# Patient Record
Sex: Male | Born: 1965
Health system: Southern US, Community
[De-identification: ages and names within clinical notes are randomized; demographics above are authoritative.]

## PROBLEM LIST (undated history)

## (undated) DIAGNOSIS — F419 Anxiety disorder, unspecified: Secondary | ICD-10-CM

## (undated) DIAGNOSIS — F329 Major depressive disorder, single episode, unspecified: Secondary | ICD-10-CM

## (undated) DIAGNOSIS — J45909 Unspecified asthma, uncomplicated: Secondary | ICD-10-CM

## (undated) DIAGNOSIS — R454 Irritability and anger: Secondary | ICD-10-CM

## (undated) DIAGNOSIS — I8390 Asymptomatic varicose veins of unspecified lower extremity: Secondary | ICD-10-CM

## (undated) DIAGNOSIS — F319 Bipolar disorder, unspecified: Secondary | ICD-10-CM

## (undated) DIAGNOSIS — F32A Depression, unspecified: Secondary | ICD-10-CM

## (undated) DIAGNOSIS — M1990 Unspecified osteoarthritis, unspecified site: Secondary | ICD-10-CM

## (undated) HISTORY — PX: VASECTOMY: SHX75

## (undated) HISTORY — PX: VARICOSE VEIN SURGERY: SHX832

## (undated) HISTORY — PX: TONSILLECTOMY: SUR1361

## (undated) HISTORY — PX: APPENDECTOMY: SHX54

---

## 2008-03-07 ENCOUNTER — Encounter: Admission: RE | Admit: 2008-03-07 | Discharge: 2008-03-07 | Payer: Self-pay | Admitting: Family Medicine

## 2008-04-03 ENCOUNTER — Encounter: Admission: RE | Admit: 2008-04-03 | Discharge: 2008-04-03 | Payer: Self-pay | Admitting: Family Medicine

## 2009-07-26 ENCOUNTER — Encounter: Admission: RE | Admit: 2009-07-26 | Discharge: 2009-08-29 | Payer: Self-pay | Admitting: Family Medicine

## 2012-02-16 ENCOUNTER — Emergency Department (HOSPITAL_COMMUNITY)
Admission: EM | Admit: 2012-02-16 | Discharge: 2012-02-16 | Disposition: A | Discharge status: Home / Self Care | Attending: Emergency Medicine | Admitting: Emergency Medicine

## 2012-02-16 ENCOUNTER — Encounter (HOSPITAL_COMMUNITY): Payer: Self-pay | Admitting: Emergency Medicine

## 2012-02-16 ENCOUNTER — Emergency Department (HOSPITAL_COMMUNITY)

## 2012-02-16 DIAGNOSIS — S2249XA Multiple fractures of ribs, unspecified side, initial encounter for closed fracture: Secondary | ICD-10-CM | POA: Insufficient documentation

## 2012-02-16 DIAGNOSIS — S2239XA Fracture of one rib, unspecified side, initial encounter for closed fracture: Secondary | ICD-10-CM

## 2012-02-16 DIAGNOSIS — W19XXXA Unspecified fall, initial encounter: Secondary | ICD-10-CM

## 2012-02-16 DIAGNOSIS — Z7982 Long term (current) use of aspirin: Secondary | ICD-10-CM | POA: Insufficient documentation

## 2012-02-16 DIAGNOSIS — R079 Chest pain, unspecified: Secondary | ICD-10-CM | POA: Insufficient documentation

## 2012-02-16 DIAGNOSIS — R1011 Right upper quadrant pain: Secondary | ICD-10-CM | POA: Insufficient documentation

## 2012-02-16 DIAGNOSIS — W08XXXA Fall from other furniture, initial encounter: Secondary | ICD-10-CM | POA: Insufficient documentation

## 2012-02-16 HISTORY — DX: Irritability and anger: R45.4

## 2012-02-16 LAB — COMPREHENSIVE METABOLIC PANEL
ALT: 31 U/L (ref 0–53)
AST: 28 U/L (ref 0–37)
Alkaline Phosphatase: 50 U/L (ref 39–117)
CO2: 27 mEq/L (ref 19–32)
Chloride: 102 mEq/L (ref 96–112)
GFR calc Af Amer: >90 mL/min (ref >90)
GFR calc non Af Amer: >90 mL/min (ref >90)
Glucose, Bld: 117 mg/dL — ABNORMAL HIGH (ref 70–99)
Potassium: 4 mEq/L (ref 3.5–5.1)
Sodium: 137 mEq/L (ref 135–145)

## 2012-02-16 LAB — CBC
Hemoglobin: 15.4 g/dL (ref 13.0–17.0)
Platelets: 147 10*3/uL — ABNORMAL LOW (ref 150–400)
RBC: 5.07 MIL/uL (ref 4.22–5.81)
WBC: 5.1 10*3/uL (ref 4.0–10.5)

## 2012-02-16 MED ORDER — OXYCODONE-ACETAMINOPHEN 5-325 MG PO TABS: 1.0000 | ORAL_TABLET | ORAL | Status: AC | PRN

## 2012-02-16 MED ORDER — MORPHINE SULFATE 4 MG/ML IJ SOLN
4.0000 mg | Freq: Once | INTRAMUSCULAR | Status: AC
  Administered 2012-02-16: 4 mg via INTRAVENOUS
  Filled 2012-02-16: qty 1

## 2012-02-16 MED ORDER — CYCLOBENZAPRINE HCL 10 MG PO TABS: 10.0000 mg | ORAL_TABLET | Freq: Every evening | ORAL | Status: AC | PRN

## 2012-02-16 MED ORDER — IBUPROFEN 600 MG PO TABS: 600.0000 mg | ORAL_TABLET | Freq: Four times a day (QID) | ORAL | Status: AC | PRN

## 2012-02-16 NOTE — ED Notes (Signed)
Pt brought in by ems s/p fall while standing on stool that gave way.Pt stated that he fell on his rt side,he now c/o of pain on rt side denies loc

## 2012-02-16 NOTE — Discharge Instructions (Signed)
Rib Fracture Your caregiver has diagnosed you as having a rib fracture (a break). This can occur by a blow to the chest, by a fall against a hard object, or by violent coughing or sneezing. There may be one or many breaks. Rib fractures may heal on their own within 3 to 8 weeks. The longer healing period is usually associated with a continued cough or other aggravating activities. HOME CARE INSTRUCTIONS   Avoid strenuous activity. Be careful during activities and avoid bumping the injured rib. Activities that cause pain pull on the fracture site(s) and are best avoided if possible.   Eat a normal, well-balanced diet. Drink plenty of fluids to avoid constipation.   Take deep breaths several times a day to keep lungs free of infection. Try to cough several times a day, splinting the injured area with a pillow. This will help prevent pneumonia.   Do not wear a rib belt or binder. These restrict breathing which can lead to pneumonia.   Only take over-the-counter or prescription medicines for pain, discomfort, or fever as directed by your caregiver.  SEEK MEDICAL CARE IF:  You develop a continual cough, associated with thick or bloody sputum. SEEK IMMEDIATE MEDICAL CARE IF:   You have a fever.   You have difficulty breathing.   You have nausea (feeling sick to your stomach), vomiting, or abdominal (belly) pain.   You have worsening pain, not controlled with medications.  Document Released: 09/23/2005 Document Revised: 09/12/2011 Document Reviewed: 02/25/2007 [facility] Patient Information 2012 [place], [place].  RESOURCE GUIDE  Dental Problems  Patients with Medicaid: [facility] [phone] [address]                                           [phone] [address] Phone:  [phone]                                                   Phone:  [phone]  If unable to pay or uninsured, contact:  [facility] or [facility]. to  become qualified for the adult dental clinic.  Chronic Pain Problems Contact [facility]  [phone] Patients need to be referred by their primary care doctor.  Insufficient Money for Medicine Contact [employer] Way:  call "211" or [facility] [phone] [phone].  No Primary Care Doctor Call [facility]  [phone] Other agencies that provide inexpensive medical care    [facility]  [phone]    [facility]  [phone]    [facility]  [phone]    [facility]  [phone]    [facility]  7693161212    [facility] Child Clinic  [phone]  Psychological Services [facility] Behavioral Health  [phone] [facility]  [phone] [facility] Mental Health   [phone] (emergency services [phone])  Abuse/Neglect [facility] Child Abuse Hotline [phone] [facility] Child Abuse Hotline (980)538-2807 (After Hours)  Emergency Shelter [facility] 4453426779  Maternity Homes [facility] [place] [facility] [phone] [facility] Services [phone]  MRSA Hotline #:  161-0960    [facility] Resources  [facility]  [employer]                           [facility] Dept. [address] [place]                     [address]         [address] [place] [address]  Blondell Reveal Phone:  [phone]                                  Phone:  [phone]                   Phone:  [phone]  [facility] Mental Health Phone:  [phone]  [facility] Child Abuse Hotline 671 740 5073 [phone] (After Hours)

## 2012-02-16 NOTE — ED Provider Notes (Signed)
History     CSN: [id number]  Arrival date & time 02/16/12  1523   First MD Initiated Contact with Patient 02/16/12 1528      Chief Complaint  Patient presents with  . Fall    (Consider location/radiation/quality/duration/timing/severity/associated sxs/prior treatment) HPI  45yoM present after fall. The patient states that just prior to arrival he was standing on 1 foot stool in the bathroom. He states that the stool slipped and he fell with his right side. He states that he struck the rim of the toilet. He complains of right upper quadrant pain, right lower rib pain. His pain is 9/10 at this time. He did not take anything for pain prior to arrival. It is worse with coughing, laughing, deep breathing, movement. He denies blunt head trauma. Denies neck pain, back pain. He denies numbness, tingling, weakness of his extremities. He does state that his shoulder is "a little sore".  Takes 1 baby aspirin per day   Past Medical History  Diagnosis Date  . Anger     History reviewed. No pertinent past surgical history.  History reviewed. No pertinent family history.  History  Substance Use Topics  . 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 Smokeless tobacco: Not on file  . Alcohol Use: 0.0 oz/week    4-5 Glasses of wine per week    Review of Systems  All other systems reviewed and are negative.  except as noted HPI   Allergies  Review of patient's allergies indicates no known allergies.  Home Medications   Current Outpatient Rx  Name Route Sig Dispense Refill  . ASPIRIN 81 MG PO CHEW Oral Chew 81 mg by mouth daily.    Marland Kitchen CETIRIZINE HCL 10 MG PO TABS Oral Take 10 mg by mouth daily.    . OMEGA-3 FATTY ACIDS 1000 MG PO CAPS Oral Take 1 g by mouth daily.    . ADULT MULTIVITAMIN W/MINERALS CH Oral Take 1 tablet by mouth daily.    . RED YEAST RICE PO Oral Take 2 capsules by mouth daily.    . VENLAFAXINE HCL ER 150 MG PO CP24 Oral Take 150 mg by mouth daily.    Marland Kitchen VITAMIN E PO Oral  Take 2 capsules by mouth daily.    . CYCLOBENZAPRINE HCL 10 MG PO TABS Oral Take 1 tablet (10 mg total) by mouth at bedtime as needed for muscle spasms. 10 tablet 0  . IBUPROFEN 600 MG PO TABS Oral Take 1 tablet (600 mg total) by mouth every 6 (six) hours as needed for pain. 30 tablet 0  . OXYCODONE-ACETAMINOPHEN 5-325 MG PO TABS Oral Take 1 tablet by mouth every 4 (four) hours as needed for pain. 20 tablet 0    BP 132/84  Pulse 86  Temp(Src) 98.3 F (36.8 C) (Oral)  Resp 24  SpO2 96%  Physical Exam  Nursing note and vitals reviewed. Constitutional: He is oriented to person, place, and time. He appears well-developed and well-nourished. No distress.  HENT:  Head: Atraumatic.  Mouth/Throat: Oropharynx is clear and moist.  Eyes: Conjunctivae are normal. Pupils are equal, round, and reactive to light.  Neck: Neck supple.  Cardiovascular: Normal rate, regular rhythm, normal heart sounds and intact distal pulses.  Exam reveals no gallop and no friction rub.   No murmur heard. Pulmonary/Chest: Effort normal. No respiratory distress. He has no wheezes. He has no rales. He exhibits tenderness.       Rt ant/lateral lower rib ttp  Abdominal: Soft. Bowel sounds  are normal. There is no tenderness. There is no rebound and no guarding.       Min RUQ ttp  Musculoskeletal: Normal range of motion. He exhibits no edema and no tenderness.       No midline c/t/l/s ttp  Min rt mid trapezius ttp. No bony ttp shoulder/elbow   Neurological: He is alert and oriented to person, place, and time.  Skin: Skin is warm and dry.  Psychiatric: He has a normal mood and affect.    ED Course  Procedures (including critical care time)  Labs Reviewed  CBC - Abnormal; Notable for the following:    Platelets 147 (*)    All other components within normal limits  COMPREHENSIVE METABOLIC PANEL - Abnormal; Notable for the following:    Glucose, Bld 117 (*)    All other components within normal limits   Dg Ribs  Unilateral W/chest Right  02/16/2012  *RADIOLOGY REPORT*  Clinical Data: Fall.  Lateral right lower rib pain.  RIGHT RIBS AND CHEST - 3+ VIEW  Comparison: 06/10/2007.  Findings: The lateral ninth rib on the right shows a displaced fracture.  Displacement is one shaft width laterally.  There is also a nondisplaced lateral tenth rib fracture.  No pneumothorax is present.  Right basilar atelectasis, probably due to splinting. Lung volumes are mildly low.  Cardiopericardial silhouette and mediastinal contours appear normal.  IMPRESSION: Right lateral ninth and tenth rib fractures.  Original Report Authenticated By: Andreas Newport, M.D.   EMERGENCY DEPARTMENT [place] FAST EXAM  INDICATIONS:Blunt injury of abdomen  PERFORMED BY: Myself  IMAGES ARCHIVED?: No  FINDINGS: All views negative  LIMITATIONS:  none  INTERPRETATION:  No abdominal free fluid  COMMENT:  Modified fast exam RUQ/LUQ/pelvic   1. Fall   2. Rib fracture     MDM  Mechanical fall. Found to have Rib fracture. He has min RUQ ttp with negative modified FAST exam and normal LFTS. Incentive spirometer, pain control.  Doubt [facility] precluding discharge at this time. Given Precautions for return. PMD f/u.         Forbes Cellar, MD 02/16/12 1705

## 2012-02-16 NOTE — ED Notes (Signed)
ZOX:[id number]<AV> Expected date:<BR> Expected time: 3:11 PM<BR> Means of arrival:Ambulance<BR> Comments:<BR> M31 -- Fall/Chest and Shoulder Injuries

## 2015-10-17 ENCOUNTER — Other Ambulatory Visit (HOSPITAL_COMMUNITY): Payer: Self-pay | Admitting: Orthopedic Surgery

## 2015-10-27 ENCOUNTER — Encounter (HOSPITAL_COMMUNITY): Payer: Self-pay

## 2015-10-27 ENCOUNTER — Encounter (HOSPITAL_COMMUNITY)
Admission: RE | Admit: 2015-10-27 | Discharge: 2015-10-27 | Disposition: A | Discharge status: Home / Self Care | Payer: Non-veteran care | Source: Ambulatory Visit | Attending: Orthopedic Surgery | Admitting: Orthopedic Surgery

## 2015-10-27 DIAGNOSIS — Z01818 Encounter for other preprocedural examination: Secondary | ICD-10-CM | POA: Diagnosis not present

## 2015-10-27 DIAGNOSIS — M19011 Primary osteoarthritis, right shoulder: Secondary | ICD-10-CM | POA: Insufficient documentation

## 2015-10-27 DIAGNOSIS — M7521 Bicipital tendinitis, right shoulder: Secondary | ICD-10-CM | POA: Insufficient documentation

## 2015-10-27 HISTORY — DX: Major depressive disorder, single episode, unspecified: F32.9

## 2015-10-27 HISTORY — DX: Unspecified asthma, uncomplicated: J45.909

## 2015-10-27 HISTORY — DX: Asymptomatic varicose veins of unspecified lower extremity: I83.90

## 2015-10-27 HISTORY — DX: Depression, unspecified: F32.A

## 2015-10-27 NOTE — Pre-Procedure Instructions (Signed)
Chad Wagner  10/27/2015      [facility] - [place], [place] - [address] [place] [address] Phone: [phone] Fax: [phone]    Your procedure is scheduled on   Thursday  11/02/15  Report to [facility] Admitting at 930 A.M.  Call this number if you have problems the morning of surgery:  [phone]   Remember:  Do not eat food or drink liquids after midnight.  Take these medicines the morning of surgery with A SIP OF WATER   BUPROPION (WELLBUTRIN), CITALOPRAM(CELEXA)   (NO ASPIRIN, IBUPROFEN/ ADVIL/ ALEVE, GOODY POWDERS/BC'S, VITAMINS, HERBAL MEDICINES)   Do not wear jewelry, make-up or nail polish.  Do not wear lotions, powders, or perfumes.  You may wear deodorant.  Do not shave 48 hours prior to surgery.  Men may shave face and neck.  Do not bring valuables to the hospital.  [facility] is not responsible for any belongings or valuables.  Contacts, dentures or bridgework may not be worn into surgery.  Leave your suitcase in the car.  After surgery it may be brought to your room.  For patients admitted to the hospital, discharge time will be determined by your treatment team.  Patients discharged the day of surgery will not be allowed to drive home.   Name and phone number of your driver:   Special instructions:  [place] - Preparing for Surgery  Before surgery, you can play an important role.  Because skin is not sterile, your skin needs to be as free of germs as possible.  You can reduce the number of germs on you skin by washing with CHG (chlorahexidine gluconate) soap before surgery.  CHG is an antiseptic cleaner which kills germs and bonds with the skin to continue killing germs even after washing.  Please DO NOT use if you have an allergy to CHG or antibacterial soaps.  If your skin becomes reddened/irritated stop using the CHG and inform your nurse when you arrive at Short Stay.  Do not shave  (including legs and underarms) for at least 48 hours prior to the first CHG shower.  You may shave your face.  Please follow these instructions carefully:   1.  Shower with CHG Soap the night before surgery and the                                morning of Surgery.  2.  If you choose to wash your hair, wash your hair first as usual with your       normal shampoo.  3.  After you shampoo, rinse your hair and body thoroughly to remove the                      Shampoo.  4.  Use CHG as you would any other liquid soap.  You can apply chg directly       to the skin and wash gently with scrungie or a clean washcloth.  5.  Apply the CHG Soap to your body ONLY FROM THE NECK DOWN.        Do not use on open wounds or open sores.  Avoid contact with your eyes,       ears, mouth and genitals (private parts).  Wash genitals (private parts)       with your normal soap.  6.  Wash thoroughly, paying special attention to the area where your surgery        will be performed.  7.  Thoroughly rinse your body with warm water from the neck down.  8.  DO NOT shower/wash with your normal soap after using and rinsing off       the CHG Soap.  9.  Pat yourself dry with a clean towel.            10.  Wear clean pajamas.            11.  Place clean sheets on your bed the night of your first shower and do not        sleep with pets.  Day of Surgery  Do not apply any lotions/deoderants the morning of surgery.  Please wear clean clothes to the hospital/surgery center.    Please read over the following fact sheets that you were given. Pain Booklet, Coughing and Deep Breathing and Blood Transfusion Information

## 2015-11-01 MED ORDER — CHLORHEXIDINE GLUCONATE 4 % EX LIQD: 60.0000 mL | Freq: Once | CUTANEOUS | Status: DC

## 2015-11-02 ENCOUNTER — Ambulatory Visit (HOSPITAL_COMMUNITY): Payer: No Typology Code available for payment source | Admitting: Anesthesiology

## 2015-11-02 ENCOUNTER — Encounter (HOSPITAL_COMMUNITY)
Admission: RE | Disposition: A | Discharge status: Home / Self Care | Payer: Self-pay | Source: Ambulatory Visit | Attending: Orthopedic Surgery

## 2015-11-02 ENCOUNTER — Ambulatory Visit (HOSPITAL_COMMUNITY)
Admission: RE | Admit: 2015-11-02 | Discharge: 2015-11-02 | Disposition: A | Discharge status: Home / Self Care | Payer: No Typology Code available for payment source | Source: Ambulatory Visit | Attending: Orthopedic Surgery | Admitting: Orthopedic Surgery

## 2015-11-02 ENCOUNTER — Encounter (HOSPITAL_COMMUNITY): Payer: Self-pay | Admitting: *Deleted

## 2015-11-02 ENCOUNTER — Ambulatory Visit (HOSPITAL_COMMUNITY): Payer: No Typology Code available for payment source | Admitting: Vascular Surgery

## 2015-11-02 ENCOUNTER — Ambulatory Visit (HOSPITAL_COMMUNITY): Payer: No Typology Code available for payment source

## 2015-11-02 DIAGNOSIS — Z791 Long term (current) use of non-steroidal anti-inflammatories (NSAID): Secondary | ICD-10-CM | POA: Insufficient documentation

## 2015-11-02 DIAGNOSIS — M75121 Complete rotator cuff tear or rupture of right shoulder, not specified as traumatic: Secondary | ICD-10-CM | POA: Insufficient documentation

## 2015-11-02 DIAGNOSIS — M7521 Bicipital tendinitis, right shoulder: Secondary | ICD-10-CM | POA: Insufficient documentation

## 2015-11-02 DIAGNOSIS — S43431A Superior glenoid labrum lesion of right shoulder, initial encounter: Secondary | ICD-10-CM | POA: Diagnosis not present

## 2015-11-02 DIAGNOSIS — J45909 Unspecified asthma, uncomplicated: Secondary | ICD-10-CM | POA: Insufficient documentation

## 2015-11-02 DIAGNOSIS — F329 Major depressive disorder, single episode, unspecified: Secondary | ICD-10-CM | POA: Diagnosis not present

## 2015-11-02 DIAGNOSIS — X58XXXA Exposure to other specified factors, initial encounter: Secondary | ICD-10-CM | POA: Insufficient documentation

## 2015-11-02 DIAGNOSIS — Z6835 Body mass index (BMI) 35.0-35.9, adult: Secondary | ICD-10-CM | POA: Diagnosis not present

## 2015-11-02 DIAGNOSIS — E669 Obesity, unspecified: Secondary | ICD-10-CM | POA: Insufficient documentation

## 2015-11-02 DIAGNOSIS — Z87891 Personal history of nicotine dependence: Secondary | ICD-10-CM | POA: Diagnosis not present

## 2015-11-02 DIAGNOSIS — Z880 Allergy status to penicillin: Secondary | ICD-10-CM | POA: Diagnosis not present

## 2015-11-02 DIAGNOSIS — Z189 Retained foreign body fragments, unspecified material: Secondary | ICD-10-CM

## 2015-11-02 HISTORY — PX: SHOULDER ARTHROSCOPY WITH OPEN ROTATOR CUFF REPAIR AND DISTAL CLAVICLE ACROMINECTOMY: SHX5683

## 2015-11-02 SURGERY — SHOULDER ARTHROSCOPY WITH OPEN ROTATOR CUFF REPAIR AND DISTAL CLAVICLE ACROMINECTOMY
Anesthesia: General | Site: Shoulder | Laterality: Right

## 2015-11-02 MED ORDER — ROCURONIUM BROMIDE 50 MG/5ML IV SOLN
INTRAVENOUS | Status: AC
  Filled 2015-11-02: qty 1

## 2015-11-02 MED ORDER — DEXTROSE 5 % IV SOLN
900.0000 mg | Freq: Once | INTRAVENOUS | Status: AC
  Administered 2015-11-02: 900 mg via INTRAVENOUS
  Filled 2015-11-02: qty 6

## 2015-11-02 MED ORDER — LIDOCAINE HCL (CARDIAC) 20 MG/ML IV SOLN
INTRAVENOUS | Status: DC | PRN
  Administered 2015-11-02: 60 mg via INTRAVENOUS

## 2015-11-02 MED ORDER — SUGAMMADEX SODIUM 200 MG/2ML IV SOLN
INTRAVENOUS | Status: AC
  Filled 2015-11-02: qty 2

## 2015-11-02 MED ORDER — METHOCARBAMOL 500 MG PO TABS: 500.0000 mg | ORAL_TABLET | Freq: Four times a day (QID) | ORAL | Status: DC

## 2015-11-02 MED ORDER — PROMETHAZINE HCL 25 MG/ML IJ SOLN: 6.2500 mg | INTRAMUSCULAR | Status: DC | PRN

## 2015-11-02 MED ORDER — SODIUM CHLORIDE 0.9 % IR SOLN
Status: DC | PRN
  Administered 2015-11-02: 1000 mL

## 2015-11-02 MED ORDER — BUPIVACAINE-EPINEPHRINE (PF) 0.5% -1:200000 IJ SOLN
INTRAMUSCULAR | Status: DC | PRN
  Administered 2015-11-02: 20 mL via PERINEURAL

## 2015-11-02 MED ORDER — SUGAMMADEX SODIUM 200 MG/2ML IV SOLN
INTRAVENOUS | Status: DC | PRN
  Administered 2015-11-02: 200 mg via INTRAVENOUS

## 2015-11-02 MED ORDER — MIDAZOLAM HCL 2 MG/2ML IJ SOLN
INTRAMUSCULAR | Status: AC
  Filled 2015-11-02: qty 2

## 2015-11-02 MED ORDER — ONDANSETRON HCL 4 MG/2ML IJ SOLN
INTRAMUSCULAR | Status: AC
  Filled 2015-11-02: qty 2

## 2015-11-02 MED ORDER — LIDOCAINE HCL (CARDIAC) 20 MG/ML IV SOLN
INTRAVENOUS | Status: AC
  Filled 2015-11-02: qty 5

## 2015-11-02 MED ORDER — ONDANSETRON HCL 4 MG/2ML IJ SOLN
INTRAMUSCULAR | Status: DC | PRN
Start: 2015-11-02 — End: 2015-11-02
  Administered 2015-11-02: 4 mg via INTRAVENOUS

## 2015-11-02 MED ORDER — FENTANYL CITRATE (PF) 100 MCG/2ML IJ SOLN
50.0000 ug | Freq: Once | INTRAMUSCULAR | Status: AC
  Administered 2015-11-02: 50 ug via INTRAVENOUS

## 2015-11-02 MED ORDER — PROPOFOL 10 MG/ML IV BOLUS
INTRAVENOUS | Status: DC | PRN
  Administered 2015-11-02: 200 mg via INTRAVENOUS

## 2015-11-02 MED ORDER — OXYCODONE-ACETAMINOPHEN 5-325 MG PO TABS: 1.0000 | ORAL_TABLET | Freq: Four times a day (QID) | ORAL | Status: DC | PRN

## 2015-11-02 MED ORDER — LACTATED RINGERS IV SOLN
INTRAVENOUS | Status: DC
Start: 2015-11-02 — End: 2015-11-04
  Administered 2015-11-02: 10:00:00 via INTRAVENOUS

## 2015-11-02 MED ORDER — FENTANYL CITRATE (PF) 250 MCG/5ML IJ SOLN
INTRAMUSCULAR | Status: AC
  Filled 2015-11-02: qty 5

## 2015-11-02 MED ORDER — LACTATED RINGERS IV SOLN
INTRAVENOUS | Status: DC | PRN
  Administered 2015-11-02 (×2): via INTRAVENOUS

## 2015-11-02 MED ORDER — BUPIVACAINE HCL (PF) 0.25 % IJ SOLN
INTRAMUSCULAR | Status: DC | PRN
  Administered 2015-11-02: 30 mL

## 2015-11-02 MED ORDER — SODIUM CHLORIDE 0.9 % IJ SOLN
INTRAMUSCULAR | Status: DC | PRN
  Administered 2015-11-02: 50 mL via INTRAVENOUS

## 2015-11-02 MED ORDER — DEXTROSE 5 % IV SOLN
10.0000 mg | INTRAVENOUS | Status: DC | PRN
  Administered 2015-11-02: 25 ug/min via INTRAVENOUS

## 2015-11-02 MED ORDER — EPINEPHRINE HCL 1 MG/ML IJ SOLN
INTRAMUSCULAR | Status: AC
  Filled 2015-11-02: qty 1

## 2015-11-02 MED ORDER — MIDAZOLAM HCL 5 MG/ML IJ SOLN: 2.0000 mg | Freq: Once | INTRAMUSCULAR | Status: DC

## 2015-11-02 MED ORDER — EPINEPHRINE HCL 1 MG/ML IJ SOLN
INTRAMUSCULAR | Status: DC | PRN
  Administered 2015-11-02: 1 mg

## 2015-11-02 MED ORDER — BUPIVACAINE HCL (PF) 0.25 % IJ SOLN
INTRAMUSCULAR | Status: AC
  Filled 2015-11-02: qty 30

## 2015-11-02 MED ORDER — FENTANYL CITRATE (PF) 100 MCG/2ML IJ SOLN
INTRAMUSCULAR | Status: AC
  Administered 2015-11-02: 100 ug via INTRAVENOUS
  Filled 2015-11-02: qty 2

## 2015-11-02 MED ORDER — PHENOL 1.4 % MT LIQD
1.0000 | OROMUCOSAL | Status: DC | PRN
Start: 2015-11-02 — End: 2015-11-04
  Administered 2015-11-02: 1 via OROMUCOSAL
  Filled 2015-11-02: qty 177

## 2015-11-02 MED ORDER — FENTANYL CITRATE (PF) 100 MCG/2ML IJ SOLN: 25.0000 ug | INTRAMUSCULAR | Status: DC | PRN

## 2015-11-02 MED ORDER — EPHEDRINE SULFATE 50 MG/ML IJ SOLN
INTRAMUSCULAR | Status: DC | PRN
  Administered 2015-11-02 (×2): 10 mg via INTRAVENOUS

## 2015-11-02 MED ORDER — PROPOFOL 10 MG/ML IV BOLUS
INTRAVENOUS | Status: AC
  Filled 2015-11-02: qty 20

## 2015-11-02 MED ORDER — PHENYLEPHRINE 40 MCG/ML (10ML) SYRINGE FOR IV PUSH (FOR BLOOD PRESSURE SUPPORT)
PREFILLED_SYRINGE | INTRAVENOUS | Status: AC
  Filled 2015-11-02: qty 10

## 2015-11-02 MED ORDER — MIDAZOLAM HCL 5 MG/5ML IJ SOLN
INTRAMUSCULAR | Status: DC | PRN
  Administered 2015-11-02: 2 mg via INTRAVENOUS

## 2015-11-02 MED ORDER — MIDAZOLAM HCL 2 MG/2ML IJ SOLN
INTRAMUSCULAR | Status: AC
  Administered 2015-11-02: 2 mg
  Filled 2015-11-02: qty 2

## 2015-11-02 MED ORDER — ROCURONIUM BROMIDE 100 MG/10ML IV SOLN
INTRAVENOUS | Status: DC | PRN
  Administered 2015-11-02: 50 mg via INTRAVENOUS

## 2015-11-02 MED FILL — METHOCARBAMOL 500 MG TABLET: 500 | 7 days supply | Qty: 30 | Fill #0

## 2015-11-02 MED FILL — OXYCODONE/APAP 5-325: 5-325 | 8 days supply | Qty: 60 | Fill #0

## 2015-11-02 SURGICAL SUPPLY — 88 items
ANCHOR CORKSCREW BIO 5.5 FT (Anchor) ×3 IMPLANT
BLADE CUTTER GATOR 3.5 (BLADE) ×6 IMPLANT
BLADE GREAT WHITE 4.2 (BLADE) ×2 IMPLANT
BLADE GREAT WHITE 4.2MM (BLADE) ×1
BLADE SURG 11 STRL SS (BLADE) ×3 IMPLANT
BUR OVAL 6.0 (BURR) ×6 IMPLANT
CLOSURE STERI-STRIP 1/2X4 (GAUZE/BANDAGES/DRESSINGS) ×1
CLOSURE WOUND 1/2 X4 (GAUZE/BANDAGES/DRESSINGS) ×1
CLSR STERI-STRIP ANTIMIC 1/2X4 (GAUZE/BANDAGES/DRESSINGS) ×2 IMPLANT
COVER SURGICAL LIGHT HANDLE (MISCELLANEOUS) ×3 IMPLANT
DECANTER SPIKE VIAL GLASS SM (MISCELLANEOUS) ×3 IMPLANT
DRAPE INCISE IOBAN 66X45 STRL (DRAPES) ×6 IMPLANT
DRAPE STERI 35X30 U-POUCH (DRAPES) ×3 IMPLANT
DRAPE U-SHAPE 47X51 STRL (DRAPES) ×6 IMPLANT
DRSG MEPILEX BORDER 4X8 (GAUZE/BANDAGES/DRESSINGS) ×3 IMPLANT
DRSG PAD ABDOMINAL 8X10 ST (GAUZE/BANDAGES/DRESSINGS) ×9 IMPLANT
DRSG TEGADERM 4X4.75 (GAUZE/BANDAGES/DRESSINGS) ×3 IMPLANT
DURAPREP 26ML APPLICATOR (WOUND CARE) ×3 IMPLANT
DURAPREP 6ML APPLICATOR 50/CS (WOUND CARE) ×3 IMPLANT
ELECT REM PT RETURN 9FT ADLT (ELECTROSURGICAL) ×3
ELECTRODE REM PT RTRN 9FT ADLT (ELECTROSURGICAL) ×1 IMPLANT
FILTER STRAW FLUID ASPIR (MISCELLANEOUS) ×3 IMPLANT
GLOVE BIO SURGEON STRL SZ8 (GLOVE) ×9 IMPLANT
GLOVE BIOGEL PI IND STRL 7.0 (GLOVE) ×1 IMPLANT
GLOVE BIOGEL PI IND STRL 7.5 (GLOVE) ×1 IMPLANT
GLOVE BIOGEL PI IND STRL 8 (GLOVE) ×3 IMPLANT
GLOVE BIOGEL PI INDICATOR 7.0 (GLOVE) ×2
GLOVE BIOGEL PI INDICATOR 7.5 (GLOVE) ×2
GLOVE BIOGEL PI INDICATOR 8 (GLOVE) ×6
GLOVE ECLIPSE 7.0 STRL STRAW (GLOVE) ×3 IMPLANT
GLOVE SURG ORTHO 8.0 STRL STRW (GLOVE) ×9 IMPLANT
GLOVE SURG SS PI 7.0 STRL IVOR (GLOVE) ×3 IMPLANT
GLOVE SURG SS PI 7.5 STRL IVOR (GLOVE) ×3 IMPLANT
GLOVE SURG SS PI 8.0 STRL IVOR (GLOVE) ×3 IMPLANT
GOWN STRL REUS W/ TWL LRG LVL3 (GOWN DISPOSABLE) ×5 IMPLANT
GOWN STRL REUS W/ TWL XL LVL3 (GOWN DISPOSABLE) ×2 IMPLANT
GOWN STRL REUS W/TWL 2XL LVL3 (GOWN DISPOSABLE) ×3 IMPLANT
GOWN STRL REUS W/TWL LRG LVL3 (GOWN DISPOSABLE) ×10
GOWN STRL REUS W/TWL XL LVL3 (GOWN DISPOSABLE) ×4
KIT BASIN OR (CUSTOM PROCEDURE TRAY) ×3 IMPLANT
KIT BIO-TENODESIS 3X8 DISP (MISCELLANEOUS) ×2
KIT INSRT BABSR STRL DISP BTN (MISCELLANEOUS) ×1 IMPLANT
KIT ROOM TURNOVER OR (KITS) ×3 IMPLANT
MANIFOLD NEPTUNE II (INSTRUMENTS) ×3 IMPLANT
NDL SUT 6 .5 CRC .975X.05 MAYO (NEEDLE) ×1 IMPLANT
NEEDLE HYPO 25GX1X1/2 BEV (NEEDLE) ×3 IMPLANT
NEEDLE HYPO 25X1 1.5 SAFETY (NEEDLE) ×3 IMPLANT
NEEDLE MAYO TAPER (NEEDLE) ×2
NEEDLE SCORPION MULTI FIRE (NEEDLE) IMPLANT
NEEDLE SPNL 18GX3.5 QUINCKE PK (NEEDLE) ×3 IMPLANT
NS IRRIG 1000ML POUR BTL (IV SOLUTION) ×3 IMPLANT
PACK SHOULDER (CUSTOM PROCEDURE TRAY) ×3 IMPLANT
PAD ARMBOARD 7.5X6 YLW CONV (MISCELLANEOUS) ×6 IMPLANT
PUSHLOCK PEEK 4.5X24 (Orthopedic Implant) ×6 IMPLANT
RESTRAINT HEAD UNIVERSAL NS (MISCELLANEOUS) ×3 IMPLANT
SCREW TENODESIS BIOCOMP 7MM (Screw) ×3 IMPLANT
SET ARTHROSCOPY TUBING (MISCELLANEOUS) ×4
SET ARTHROSCOPY TUBING LN (MISCELLANEOUS) ×2 IMPLANT
SHAVER EXTENDED LENGTH 4.2 (BLADE) ×3 IMPLANT
SLING ARM IMMOBILIZER LRG (SOFTGOODS) ×3 IMPLANT
SLING ARM IMMOBILIZER MED (SOFTGOODS) IMPLANT
SPONGE LAP 18X18 X RAY DECT (DISPOSABLE) ×3 IMPLANT
SPONGE LAP 4X18 X RAY DECT (DISPOSABLE) ×6 IMPLANT
STRIP CLOSURE SKIN 1/2X4 (GAUZE/BANDAGES/DRESSINGS) ×2 IMPLANT
SUCTION FRAZIER HANDLE 10FR (MISCELLANEOUS) ×4
SUCTION TUBE FRAZIER 10FR DISP (MISCELLANEOUS) ×2 IMPLANT
SUT ETHILON 3 0 PS 1 (SUTURE) ×9 IMPLANT
SUT FIBERWIRE #2 38 T-5 BLUE (SUTURE)
SUT MNCRL AB 3-0 PS2 18 (SUTURE) ×6 IMPLANT
SUT VIC AB 0 CT1 27 (SUTURE) ×2
SUT VIC AB 0 CT1 27XBRD ANBCTR (SUTURE) ×1 IMPLANT
SUT VIC AB 1 CT1 27 (SUTURE) ×4
SUT VIC AB 1 CT1 27XBRD ANBCTR (SUTURE) ×2 IMPLANT
SUT VIC AB 2-0 CT1 27 (SUTURE) ×2
SUT VIC AB 2-0 CT1 TAPERPNT 27 (SUTURE) ×1 IMPLANT
SUT VIC AB 3-0 PS2 18 (SUTURE) ×2
SUT VIC AB 3-0 PS2 18XBRD (SUTURE) ×1 IMPLANT
SUT VICRYL 0 UR6 27IN ABS (SUTURE) ×3 IMPLANT
SUTURE FIBERWR #2 38 T-5 BLUE (SUTURE) IMPLANT
SYR 20CC LL (SYRINGE) ×6 IMPLANT
SYR 3ML LL SCALE MARK (SYRINGE) ×3 IMPLANT
SYR CONTROL 10ML LL (SYRINGE) ×3 IMPLANT
SYR TB 1ML LUER SLIP (SYRINGE) ×3 IMPLANT
TOWEL OR 17X24 6PK STRL BLUE (TOWEL DISPOSABLE) ×6 IMPLANT
TOWEL OR 17X26 10 PK STRL BLUE (TOWEL DISPOSABLE) ×3 IMPLANT
WAND HAND CNTRL MULTIVAC 90 (MISCELLANEOUS) ×3 IMPLANT
WATER STERILE IRR 1000ML POUR (IV SOLUTION) ×3 IMPLANT
YANKAUER SUCT BULB TIP NO VENT (SUCTIONS) ×3 IMPLANT

## 2015-11-02 NOTE — Anesthesia Preprocedure Evaluation (Addendum)
Anesthesia Evaluation  Patient identified by MRN, date of birth, ID band Patient awake    Reviewed: Allergy & Precautions, NPO status , Patient's Chart, lab work & pertinent test results  History of Anesthesia Complications Negative for: history of anesthetic complications  Airway Mallampati: I  TM Distance: >3 FB Neck ROM: Full    Dental  (+) Teeth Intact, Dental Advisory Given   Pulmonary asthma , former smoker,    Pulmonary exam normal breath sounds clear to auscultation       Cardiovascular Exercise Tolerance: Good negative cardio ROS Normal cardiovascular exam Rhythm:Regular Rate:Bradycardia     Neuro/Psych PSYCHIATRIC DISORDERS Depression negative neurological ROS     GI/Hepatic negative GI ROS, Neg liver ROS,   Endo/Other  Obesity   Renal/GU negative Renal ROS     Musculoskeletal   Abdominal   Peds  Hematology negative hematology ROS (+)   Anesthesia Other Findings Day of surgery medications reviewed with the patient.  Reproductive/Obstetrics                          Anesthesia Physical Anesthesia Plan  ASA: II  Anesthesia Plan: General   Post-op Pain Management: GA combined w/ Regional for post-op pain   Induction: Intravenous  Airway Management Planned: Oral ETT  Additional Equipment:   Intra-op Plan:   Post-operative Plan: Extubation in OR  Informed Consent: I have reviewed the patients History and Physical, chart, labs and discussed the procedure including the risks, benefits and alternatives for the proposed anesthesia with the patient or authorized representative who has indicated his/her understanding and acceptance.   Dental advisory given  Plan Discussed with: CRNA  Anesthesia Plan Comments: (Risks/benefits of general anesthesia discussed with patient including risk of damage to teeth, lips, gum, and tongue, nausea/vomiting, allergic reactions to  medications, and the possibility of heart attack, stroke and death.  All patient questions answered.  Patient wishes to proceed.  GETA plus ISB)        Anesthesia Quick Evaluation

## 2015-11-02 NOTE — Anesthesia Procedure Notes (Addendum)
Anesthesia Regional Block:  Interscalene brachial plexus block  Pre-Anesthetic Checklist: ,, timeout performed, Correct Patient, Correct Site, Correct Laterality, Correct Procedure, Correct Position, site marked, Risks and benefits discussed,  Surgical consent,  Pre-op evaluation,  At surgeon's request and post-op pain management   Prep: chloraprep       Needles:  Injection technique: Single-shot  Needle Type: Echogenic Stimulator Needle     Needle Length: 5cm 5 cm Needle Gauge: 22 and 22 G    Additional Needles:  Procedures: ultrasound guided (picture in chart) and nerve stimulator Interscalene brachial plexus block Narrative:  Injection made incrementally with aspirations every 5 mL.  Performed by: Personally  Anesthesiologist: Catalina Gravel  Additional Notes: Functioning IV was confirmed and monitors were applied.  A 21m 22ga Arrow echogenic stimulator needle was used. Sterile prep, hand hygiene and sterile gloves were used.  Negative aspiration and negative test dose prior to incremental administration of local anesthetic. The patient tolerated the procedure well.  Ultrasound guidance: relevent anatomy identified, needle position confirmed, local anesthetic spread visualized around nerve(s), vascular puncture avoided.  Image printed for medical record.    Procedure Name: Intubation Date/Time: 11/02/2015 12:18 PM Performed by: AEligha BridegroomPre-anesthesia Checklist: Patient identified, Patient being monitored, Emergency Drugs available, Timeout performed and Suction available Patient Re-evaluated:Patient Re-evaluated prior to inductionOxygen Delivery Method: Circle system utilized Preoxygenation: Pre-oxygenation with 100% oxygen Intubation Type: IV induction Ventilation: Mask ventilation without difficulty and Oral airway inserted - appropriate to patient size Laryngoscope Size: Mac and 4 Grade View: Grade II Tube type: Oral Tube size: 7.5 mm Number of  attempts: 1 Airway Equipment and Method: Stylet and LTA kit utilized Placement Confirmation: ETT inserted through vocal cords under direct vision and breath sounds checked- equal and bilateral Secured at: 23 cm Tube secured with: Tape Dental Injury: Teeth and Oropharynx as per pre-operative assessment

## 2015-11-02 NOTE — H&P (Signed)
Chad Wagner is an 50 y.o. male.   Chief Complaint: Right shoulder pain HPI: Chad Wagner is a 50 year old patient with long history of right shoulder pain. He has been evaluated at the [facility] and demonstrates significant pain with overhead activity in the shoulder. He's had a subacromial injection which gave him relief he also has had an before meals joint injection which gave him less relief but did get some relief. He localizes his pain to the deltoid region as well as to the before meals joint region. 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 He is failed nonoperative management presents for operative management.  Past Medical History  Diagnosis Date  . Anger   . Varicose vein of leg   . Asthma     MILD  DUE TO ALLERGIES   (RESOLVED)  . Depression     Past Surgical History  Procedure Laterality Date  . Varicose vein surgery      1993  . Vasectomy      2010  . Tonsillectomy      T+A 1980  . Appendectomy      1985    History reviewed. No pertinent family history. Social History:  reports that he has quit smoking. He does not have any smokeless tobacco history on file. He reports that he drinks alcohol. He reports that he does not use illicit drugs.  Allergies:  Allergies  Allergen Reactions  . Penicillins Swelling    Noted 01/2013 - swollen gland in tongue    Medications Prior to Admission  Medication Sig Dispense Refill  . buPROPion (WELLBUTRIN SR) 100 MG 12 hr tablet Take 1,000 mg by mouth daily.    . cetirizine (ZYRTEC) 10 MG tablet Take 10 mg by mouth daily.    . citalopram (CELEXA) 40 MG tablet Take 40 mg by mouth daily.    Marland Kitchen VITAMIN D3 SUPER STRENGTH 2000 units tablet Take 2,000 Units by mouth daily.      No results found for this or any previous visit (from the past 48 hour(s)). No results found.  Review of Systems  Constitutional: Negative.   HENT: Negative.    Eyes: Negative.   Respiratory: Negative.   Cardiovascular: Negative.   Gastrointestinal: Negative.   Genitourinary: Negative.   Musculoskeletal: Positive for joint pain.  Skin: Negative.   Neurological: Negative.   Endo/Heme/Allergies: Negative.   Psychiatric/Behavioral: Negative.     Blood pressure 138/89, pulse 60, temperature 97.2 F (36.2 C), temperature source Oral, resp. rate 21, height 5' 10.5" (1.791 m), weight 114.958 kg (253 lb 7 oz), SpO2 95 %. Physical Exam  Constitutional: He appears well-developed.  HENT:  Head: Normocephalic.  Eyes: Pupils are equal, round, and reactive to light.  Neck: Normal range of motion.  Cardiovascular: Normal rate.   Respiratory: Effort normal.  Neurological: He is alert.  Skin: Skin is warm.  Psychiatric: He has a normal mood and affect.   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 He's had a subacromial injection and did get relief from that also had an before meals joint injection and got moderate relief but it was short-lived. He presents now for operative management with arthroscopy distal clavicle excision possible biceps tenodesis possible rotator cuff. Risk and benefits discussed with the patient including not limited to infection nerve vessel damage shoulder stiffness incomplete healing all questions  answered tear repair along with bursectomy and subacromial decompression  Laranda Burkemper SCOTT 11/02/2015, 11:58 AM

## 2015-11-02 NOTE — Op Note (Signed)
NAME:  Chad, Wagner NO.:  [id number]  MEDICAL RECORD NO.:  [id number]  LOCATION:  [facility]                         FACILITY:  [facility]  PHYSICIAN:  Burnard Bunting, M.D.    DATE OF BIRTH:  Mar 26, 1966  DATE OF PROCEDURE: DATE OF DISCHARGE:                              OPERATIVE REPORT   PREOPERATIVE DIAGNOSIS:  Right shoulder SLAP tear, rotator cuff tear, acromioclavicular joint arthritis, biceps tendonitis.  POSTOPERATIVE DIAGNOSIS:  Right shoulder SLAP tear, rotator cuff tear, acromioclavicular joint arthritis, biceps tendonitis.  PROCEDURE:  Right shoulder arthroscopy, labral debridement, type 2 SLAP tear.  Debridement of the superior labrum.  Arthroscopic distal clavicle excision, open biceps tenodesis.  Mini-open rotator cuff tear repair.  SURGEON:  Burnard Bunting, M.D.  ASSISTANT:  Patrick Jupiter, RNFA.  INDICATIONS:  Chad Wagner is a 50 year old patient with right shoulder pain, refractory, nonoperative management, presents now for operative management after explanation of risks and benefits.  PROCEDURE:  The patient was brought to the operating room, where general endotracheal anesthesia was used.  Preoperative antibiotics were administered.  Time-out was called.  The patient was placed in a beach- chair position with the head in neutral position.  Right shoulder was prescrubbed with alcohol and Betadine, allowed to air dry, prepped with DuraPrep solution draped in sterile manner.  Chad Wagner was used to cover the axilla.  Solution of saline with epinephrine injected in the subacromial space.  At this time, posterior portal created 2 cm medial and inferior to the costal margin acromion.  Diagnostic arthroscopy was performed. Anterior portal created under direct visualization.  The patient had intact glenohumeral articular surfaces, rotator cuff tear, again measuring about 1 cm x 1 cm, leading edge of the supraspinatus.  Biceps tendon had significant fraying  as well, particularly as it dove into the bicipital groove.  The biceps tendon was released, thorough debridement was performed of the superior labrum.  Rotator cuff tear was identified and also debrided with the shaver.  At this time, scope placed in the subacromial space.  Lateral portal created under direct visualization. Anterior portal was created in line with the distal end of the clavicle. Subacromial decompression bursectomy was performed.  Arthroscopic distal clavicle excision was then performed, removing about the distal 8 mm of the clavicle, preserving the posterior ligaments and superior ligaments. This was checked with arthroscopy from the front.  At this time, instruments were removed.  Portals were closed using 3-0 nylon.  Ioban then used to cover the entire operative field.  An incision was made off the anterolateral margin of the acromion.  Deltoid split between the raphe and measured distance of 4 cm, marked with a #1 Vicryl suture in the fascia between the muscle fibers itself.  The rotator cuff tear was identified.  It is essentially a 95% thickness tear of the supraspinatus.  The tear was then mobilized.  The footprint prepared with a curette.  Thorough irrigation performed.  The tear was then repaired watertight fashion using 155 corkscrew and 2 push locks.  At this time, the arm was taken through a range of motion and the tear was found to have good stability, was anchored with 2 push locks  in a crossing suture type fashion to matt down the rotator cuff to the bleeding bone.  At this time, the incision was thoroughly irrigated. Deltoid split and closed using #1 Vicryl suture, followed by interrupted inverted 2-0 Vicryl suture and 3-0 Monocryl.  Watertight dressings were applied.  Shoulder sling applied.  The patient tolerated the procedure well without immediate complications.  Transferred to the recovery room in stable condition.     Burnard Bunting,  M.D.     GSD/MEDQ  D:  11/02/2015  T:  11/02/2015  Job:  [id number]

## 2015-11-02 NOTE — Brief Op Note (Signed)
11/02/2015  3:17 PM  PATIENT:  Chad Wagner  50 y.o. male  PRE-OPERATIVE DIAGNOSIS:  RIGHT SHOULDER ACROMIOCLAVICULAR OSTEOARTHRITIS, BICEPS TENDONITIS slap tear, rotator cuff tear  POST-OPERATIVE DIAGNOSIS:  RIGHT SHOULDER ACROMIOCLAVICULAR OSTEOARTHRITIS, BICEPS TENDONITIS,slap tear, rotator cuff tear  PROCEDURE:  Procedure(s): SHOULDER DIAGNOSTIC OPERATIVE ARTHROSCOPY WITH labral debridement MINI-OPEN ROTATOR CUFF REPAIR, DISTAL CLAVICLE EXCISION,  BICEPS TENODESIS.    SURGEON:  Surgeon(s): Cammy Copa, MD  ASSISTANT: Patrick Jupiter rnfa  ANESTHESIA:   general  EBL: 25 ml    Total I/O In: 1000 [I.V.:1000] Out: -   BLOOD ADMINISTERED: none  DRAINS: none   LOCAL MEDICATIONS USED:  none  SPECIMEN:  No Specimen  COUNTS:  YES  TOURNIQUET:  * No tourniquets in log *  DICTATION: .Other Dictation: Dictation Number [id number]  PLAN OF CARE: Discharge to home after PACU  PATIENT DISPOSITION:  PACU - hemodynamically stable

## 2015-11-02 NOTE — Anesthesia Postprocedure Evaluation (Signed)
Anesthesia Post Note  Patient: Chad Wagner  Procedure(s) Performed: Procedure(s) (LRB): SHOULDER DIAGNOSTIC OPERATIVE ARTHROSCOPY WITH MINI-OPEN ROTATOR CUFF REPAIR, DISTAL CLAVICLE EXCISION,  BICEPS TENODESIS.   (Right)  Patient location during evaluation: PACU Anesthesia Type: General and Regional Level of consciousness: awake and alert Pain management: pain level controlled Vital Signs Assessment: post-procedure vital signs reviewed and stable Respiratory status: spontaneous breathing, nonlabored ventilation, respiratory function stable and patient connected to nasal cannula oxygen Cardiovascular status: blood pressure returned to baseline and stable Postop Assessment: no signs of nausea or vomiting Anesthetic complications: no    Last Vitals:  Filed Vitals:   11/02/15 1630 11/02/15 1645  BP:  113/80  Pulse: 68 61  Temp:  36.2 C  Resp: 20 21    Last Pain:  Filed Vitals:   11/02/15 1646  PainSc: 0-No pain                 Cecile Hearing

## 2015-11-02 NOTE — Transfer of Care (Signed)
Immediate Anesthesia Transfer of Care Note  Patient: Chad Wagner  Procedure(s) Performed: Procedure(s): SHOULDER DIAGNOSTIC OPERATIVE ARTHROSCOPY WITH MINI-OPEN ROTATOR CUFF REPAIR, DISTAL CLAVICLE EXCISION,  BICEPS TENODESIS.   (Right)  Patient Location: PACU  Anesthesia Type:General  Level of Consciousness: awake, alert  and oriented  Airway & Oxygen Therapy: Patient Spontanous Breathing and Patient connected to nasal cannula oxygen  Post-op Assessment: Report given to RN and Post -op Vital signs reviewed and stable  Post vital signs: Reviewed and stable  Last Vitals:  Filed Vitals:   11/02/15 1558 11/02/15 1600  BP:  101/65  Pulse:  68  Temp: 36.6 C   Resp:  22    Complications: No apparent anesthesia complications

## 2015-11-03 ENCOUNTER — Encounter (HOSPITAL_COMMUNITY): Payer: Self-pay | Admitting: Orthopedic Surgery

## 2015-11-10 MED FILL — METHOCARBAMOL 500 MG TABLET: 500 | 10 days supply | Qty: 30 | Fill #0

## 2015-11-10 MED FILL — oxyCODONE HCL 5 MG TABS: 5 | 10 days supply | Qty: 60 | Fill #0

## 2016-01-12 ENCOUNTER — Other Ambulatory Visit: Payer: Self-pay | Admitting: Orthopedic Surgery

## 2016-01-23 ENCOUNTER — Encounter (HOSPITAL_COMMUNITY)
Admission: RE | Admit: 2016-01-23 | Discharge: 2016-01-23 | Disposition: A | Discharge status: Home / Self Care | Payer: No Typology Code available for payment source | Source: Ambulatory Visit | Attending: Orthopedic Surgery | Admitting: Orthopedic Surgery

## 2016-01-23 DIAGNOSIS — Z01812 Encounter for preprocedural laboratory examination: Secondary | ICD-10-CM | POA: Diagnosis not present

## 2016-01-23 DIAGNOSIS — M1612 Unilateral primary osteoarthritis, left hip: Secondary | ICD-10-CM | POA: Insufficient documentation

## 2016-01-23 DIAGNOSIS — Z0183 Encounter for blood typing: Secondary | ICD-10-CM | POA: Insufficient documentation

## 2016-01-23 LAB — BASIC METABOLIC PANEL
ANION GAP: 8 (ref 5–15)
BUN: 11 mg/dL (ref 6–20)
CALCIUM: 9.3 mg/dL (ref 8.9–10.3)
CHLORIDE: 105 mmol/L (ref 101–111)
CO2: 27 mmol/L (ref 22–32)
CREATININE: 0.82 mg/dL (ref 0.61–1.24)
GFR calc non Af Amer: >60 mL/min (ref >60)
GLUCOSE: 116 mg/dL — AB (ref 65–99)
Potassium: 4.3 mmol/L (ref 3.5–5.1)
Sodium: 140 mmol/L (ref 135–145)

## 2016-01-23 LAB — CBC
HCT: 48.4 % (ref 39.0–52.0)
HEMOGLOBIN: 16.3 g/dL (ref 13.0–17.0)
MCH: 30.2 pg (ref 26.0–34.0)
MCHC: 33.7 g/dL (ref 30.0–36.0)
MCV: 89.6 fL (ref 78.0–100.0)
Platelets: 141 10*3/uL — ABNORMAL LOW (ref 150–400)
RBC: 5.4 MIL/uL (ref 4.22–5.81)
RDW: 13.2 % (ref 11.5–15.5)
WBC: 5.3 10*3/uL (ref 4.0–10.5)

## 2016-01-23 LAB — URINE MICROSCOPIC-ADD ON

## 2016-01-23 LAB — URINALYSIS, ROUTINE W REFLEX MICROSCOPIC
Glucose, UA: NEGATIVE mg/dL
Ketones, ur: NEGATIVE mg/dL
LEUKOCYTES UA: NEGATIVE
Nitrite: NEGATIVE
PROTEIN: NEGATIVE mg/dL
SPECIFIC GRAVITY, URINE: 1.027 (ref 1.005–1.030)
pH: 5.5 (ref 5.0–8.0)

## 2016-01-23 LAB — ABO/RH: ABO/RH(D): O POS

## 2016-01-23 LAB — SURGICAL PCR SCREEN
MRSA, PCR: NEGATIVE
STAPHYLOCOCCUS AUREUS: POSITIVE — AB

## 2016-01-23 LAB — TYPE AND SCREEN
ABO/RH(D): O POS
Antibody Screen: NEGATIVE

## 2016-01-23 MED ORDER — CHLORHEXIDINE GLUCONATE 4 % EX LIQD: 60.0000 mL | Freq: Once | CUTANEOUS | Status: DC

## 2016-01-23 MED FILL — MUPIROCIN 2% OINTMENT: 2 | 5 days supply | Qty: 22 | Fill #0

## 2016-01-23 NOTE — Pre-Procedure Instructions (Signed)
    Chad Wagner  01/23/2016      [facility] [place] - [address] [place] [address] Phone: [phone] Fax: [phone]    Your procedure is scheduled on January 30, 2016.  [facility].M[facility]  Call this number if you have problems the morning of surgery:  [phone]   Remember:  Do not eat food or drink liquids after midnight.  Take these medicines the morning of surgery with A SIP OF WATER : buPROPion (WELLBUTRIN SR), cetirizine (ZYRTEC); If needed: acetaminophen (TYLENOL)   Do not wear jewelry.  Do not wear lotions, powders, or colgnes.  You may wear deodorant.  Men may shave face and neck only.  Do not bring valuables to the hospital.  [facility] is not [facility] any belongings or valuables.  Contacts, dentures or bridgework may not be worn into surgery.  Leave your suitcase in the car.  After surgery it may be brought to your room.  For patients admitted to the hospital, discharge time will be determined by your treatment team.  Patients discharged the day of surgery will not be allowed to drive home.   Name and phone number of your driver:    Special instructions:  "PREPARING FOR SURGERY"  Please read over the following fact sheets that you were given. Pain Booklet, Coughing and Deep Breathing, Blood Transfusion Information, Total Joint Packet and Surgical Site Infection Prevention

## 2016-01-24 LAB — URINE CULTURE

## 2016-01-29 MED ORDER — TRANEXAMIC ACID 1000 MG/10ML IV SOLN
1000.0000 mg | INTRAVENOUS | Status: AC
  Administered 2016-01-30: 1000 mg via INTRAVENOUS
  Filled 2016-01-29: qty 10

## 2016-01-29 MED ORDER — CLINDAMYCIN PHOSPHATE 900 MG/50ML IV SOLN
900.0000 mg | INTRAVENOUS | Status: AC
  Administered 2016-01-30: 900 mg via INTRAVENOUS
  Filled 2016-01-29: qty 50

## 2016-01-30 ENCOUNTER — Encounter (HOSPITAL_COMMUNITY)
Admission: RE | Disposition: A | Discharge status: Home / Self Care | Payer: Self-pay | Source: Ambulatory Visit | Attending: Orthopedic Surgery

## 2016-01-30 ENCOUNTER — Inpatient Hospital Stay (HOSPITAL_COMMUNITY)
Admission: RE | Admit: 2016-01-30 | Discharge: 2016-02-02 | DRG: 470 | Disposition: A | Discharge status: Home / Self Care | Payer: No Typology Code available for payment source | Source: Ambulatory Visit | Attending: Orthopedic Surgery | Admitting: Orthopedic Surgery

## 2016-01-30 ENCOUNTER — Inpatient Hospital Stay (HOSPITAL_COMMUNITY): Payer: No Typology Code available for payment source

## 2016-01-30 ENCOUNTER — Inpatient Hospital Stay (HOSPITAL_COMMUNITY): Payer: No Typology Code available for payment source | Admitting: Certified Registered Nurse Anesthetist

## 2016-01-30 ENCOUNTER — Encounter (HOSPITAL_COMMUNITY): Payer: Self-pay | Admitting: *Deleted

## 2016-01-30 DIAGNOSIS — Z6835 Body mass index (BMI) 35.0-35.9, adult: Secondary | ICD-10-CM | POA: Diagnosis not present

## 2016-01-30 DIAGNOSIS — Z87891 Personal history of nicotine dependence: Secondary | ICD-10-CM

## 2016-01-30 DIAGNOSIS — M1610 Unilateral primary osteoarthritis, unspecified hip: Secondary | ICD-10-CM | POA: Diagnosis present

## 2016-01-30 DIAGNOSIS — J45909 Unspecified asthma, uncomplicated: Secondary | ICD-10-CM | POA: Diagnosis present

## 2016-01-30 DIAGNOSIS — F329 Major depressive disorder, single episode, unspecified: Secondary | ICD-10-CM | POA: Diagnosis present

## 2016-01-30 DIAGNOSIS — Z419 Encounter for procedure for purposes other than remedying health state, unspecified: Secondary | ICD-10-CM

## 2016-01-30 DIAGNOSIS — E669 Obesity, unspecified: Secondary | ICD-10-CM | POA: Diagnosis present

## 2016-01-30 DIAGNOSIS — M1612 Unilateral primary osteoarthritis, left hip: Secondary | ICD-10-CM | POA: Diagnosis present

## 2016-01-30 HISTORY — PX: TOTAL HIP ARTHROPLASTY: SHX124

## 2016-01-30 SURGERY — ARTHROPLASTY, HIP, TOTAL, ANTERIOR APPROACH
Anesthesia: General | Laterality: Left

## 2016-01-30 MED ORDER — ROCURONIUM BROMIDE 50 MG/5ML IV SOLN
INTRAVENOUS | Status: AC
  Filled 2016-01-30: qty 1

## 2016-01-30 MED ORDER — HYDROMORPHONE HCL 1 MG/ML IJ SOLN
0.2500 mg | INTRAMUSCULAR | Status: DC | PRN
  Administered 2016-01-30 (×2): 0.5 mg via INTRAVENOUS

## 2016-01-30 MED ORDER — FENTANYL CITRATE (PF) 250 MCG/5ML IJ SOLN
INTRAMUSCULAR | Status: AC
  Filled 2016-01-30: qty 5

## 2016-01-30 MED ORDER — ARTIFICIAL TEARS OP OINT
TOPICAL_OINTMENT | OPHTHALMIC | Status: AC
  Filled 2016-01-30: qty 3.5

## 2016-01-30 MED ORDER — MIDAZOLAM HCL 5 MG/5ML IJ SOLN
INTRAMUSCULAR | Status: DC | PRN
  Administered 2016-01-30: 2 mg via INTRAVENOUS

## 2016-01-30 MED ORDER — OXYCODONE HCL 5 MG/5ML PO SOLN: 5.0000 mg | Freq: Once | ORAL | Status: AC | PRN

## 2016-01-30 MED ORDER — HYDROMORPHONE HCL 1 MG/ML IJ SOLN
1.0000 mg | INTRAMUSCULAR | Status: DC | PRN
  Administered 2016-01-31: 1 mg via INTRAVENOUS
  Filled 2016-01-30: qty 1

## 2016-01-30 MED ORDER — ONDANSETRON HCL 4 MG/2ML IJ SOLN
INTRAMUSCULAR | Status: AC
  Filled 2016-01-30: qty 2

## 2016-01-30 MED ORDER — METHOCARBAMOL 1000 MG/10ML IJ SOLN
500.0000 mg | Freq: Four times a day (QID) | INTRAVENOUS | Status: DC | PRN
  Filled 2016-01-30: qty 5

## 2016-01-30 MED ORDER — ALBUMIN HUMAN 5 % IV SOLN
INTRAVENOUS | Status: DC | PRN
  Administered 2016-01-30 (×3): via INTRAVENOUS

## 2016-01-30 MED ORDER — PHENOL 1.4 % MT LIQD: 1.0000 | OROMUCOSAL | Status: DC | PRN

## 2016-01-30 MED ORDER — BUPROPION HCL ER (SR) 100 MG PO TB12
100.0000 mg | ORAL_TABLET | Freq: Every day | ORAL | Status: DC
  Administered 2016-01-31 – 2016-02-02 (×3): 100 mg via ORAL
  Filled 2016-01-30 (×3): qty 1

## 2016-01-30 MED ORDER — ACETAMINOPHEN 650 MG RE SUPP: 650.0000 mg | Freq: Four times a day (QID) | RECTAL | Status: DC | PRN

## 2016-01-30 MED ORDER — OXYCODONE HCL 5 MG PO TABS
5.0000 mg | ORAL_TABLET | ORAL | Status: DC | PRN
  Administered 2016-01-30: 10 mg via ORAL
  Administered 2016-01-31: 5 mg via ORAL
  Administered 2016-01-31: 10 mg via ORAL
  Administered 2016-01-31: 5 mg via ORAL
  Administered 2016-01-31 – 2016-02-02 (×15): 10 mg via ORAL
  Filled 2016-01-30 (×8): qty 2
  Filled 2016-01-30: qty 1
  Filled 2016-01-30 (×6): qty 2
  Filled 2016-01-30 (×2): qty 1
  Filled 2016-01-30 (×4): qty 2

## 2016-01-30 MED ORDER — 0.9 % SODIUM CHLORIDE (POUR BTL) OPTIME
TOPICAL | Status: DC | PRN
  Administered 2016-01-30 (×4): 1000 mL

## 2016-01-30 MED ORDER — LORATADINE 10 MG PO TABS
10.0000 mg | ORAL_TABLET | Freq: Every day | ORAL | Status: DC
  Administered 2016-01-31 – 2016-02-02 (×3): 10 mg via ORAL
  Filled 2016-01-30 (×3): qty 1

## 2016-01-30 MED ORDER — CITALOPRAM HYDROBROMIDE 40 MG PO TABS
40.0000 mg | ORAL_TABLET | Freq: Every evening | ORAL | Status: DC
  Administered 2016-01-30 – 2016-02-01 (×3): 40 mg via ORAL
  Filled 2016-01-30 (×3): qty 1

## 2016-01-30 MED ORDER — VANCOMYCIN HCL IN DEXTROSE 1-5 GM/200ML-% IV SOLN
1000.0000 mg | Freq: Two times a day (BID) | INTRAVENOUS | Status: AC
  Administered 2016-01-30: 1000 mg via INTRAVENOUS
  Filled 2016-01-30: qty 200

## 2016-01-30 MED ORDER — BUPIVACAINE-EPINEPHRINE (PF) 0.25% -1:200000 IJ SOLN
INTRAMUSCULAR | Status: AC
  Filled 2016-01-30: qty 30

## 2016-01-30 MED ORDER — OXYCODONE HCL 5 MG PO TABS
5.0000 mg | ORAL_TABLET | Freq: Once | ORAL | Status: AC | PRN
  Administered 2016-01-30: 5 mg via ORAL

## 2016-01-30 MED ORDER — OXYCODONE HCL 5 MG PO TABS
ORAL_TABLET | ORAL | Status: AC
  Filled 2016-01-30: qty 1

## 2016-01-30 MED ORDER — ONDANSETRON HCL 4 MG/2ML IJ SOLN
4.0000 mg | Freq: Four times a day (QID) | INTRAMUSCULAR | Status: DC | PRN
  Administered 2016-01-30: 4 mg via INTRAVENOUS
  Filled 2016-01-30: qty 2

## 2016-01-30 MED ORDER — MIDAZOLAM HCL 2 MG/2ML IJ SOLN
INTRAMUSCULAR | Status: AC
  Filled 2016-01-30: qty 2

## 2016-01-30 MED ORDER — METHOCARBAMOL 500 MG PO TABS
500.0000 mg | ORAL_TABLET | Freq: Four times a day (QID) | ORAL | Status: DC | PRN
  Administered 2016-01-31 – 2016-02-02 (×9): 500 mg via ORAL
  Filled 2016-01-30 (×9): qty 1

## 2016-01-30 MED ORDER — DEXAMETHASONE SODIUM PHOSPHATE 4 MG/ML IJ SOLN
INTRAMUSCULAR | Status: AC
  Filled 2016-01-30: qty 1

## 2016-01-30 MED ORDER — MENTHOL 3 MG MT LOZG
1.0000 | LOZENGE | OROMUCOSAL | Status: DC | PRN
  Administered 2016-01-31: 3 mg via ORAL
  Filled 2016-01-30: qty 9

## 2016-01-30 MED ORDER — SODIUM CHLORIDE 0.9 % IR SOLN
Status: DC | PRN
  Administered 2016-01-30: 3000 mL
  Administered 2016-01-30: 1000 mL

## 2016-01-30 MED ORDER — TRANEXAMIC ACID 1000 MG/10ML IV SOLN
2000.0000 mg | Freq: Once | INTRAVENOUS | Status: DC
  Filled 2016-01-30: qty 20

## 2016-01-30 MED ORDER — BUPIVACAINE HCL (PF) 0.25 % IJ SOLN
INTRAMUSCULAR | Status: DC | PRN
  Administered 2016-01-30: 20 mL

## 2016-01-30 MED ORDER — METOCLOPRAMIDE HCL 5 MG/ML IJ SOLN: 5.0000 mg | Freq: Three times a day (TID) | INTRAMUSCULAR | Status: DC | PRN

## 2016-01-30 MED ORDER — HYDROMORPHONE HCL 1 MG/ML IJ SOLN
INTRAMUSCULAR | Status: AC
  Filled 2016-01-30: qty 1

## 2016-01-30 MED ORDER — ACETAMINOPHEN 325 MG PO TABS
650.0000 mg | ORAL_TABLET | Freq: Four times a day (QID) | ORAL | Status: DC | PRN
  Administered 2016-01-31: 650 mg via ORAL
  Filled 2016-01-30: qty 2

## 2016-01-30 MED ORDER — BUPIVACAINE LIPOSOME 1.3 % IJ SUSP
INTRAMUSCULAR | Status: DC | PRN
  Administered 2016-01-30: 20 mL

## 2016-01-30 MED ORDER — ROCURONIUM BROMIDE 100 MG/10ML IV SOLN
INTRAVENOUS | Status: DC | PRN
  Administered 2016-01-30: 20 mg via INTRAVENOUS
  Administered 2016-01-30: 50 mg via INTRAVENOUS
  Administered 2016-01-30 (×2): 15 mg via INTRAVENOUS

## 2016-01-30 MED ORDER — SUGAMMADEX SODIUM 200 MG/2ML IV SOLN
INTRAVENOUS | Status: AC
  Filled 2016-01-30: qty 2

## 2016-01-30 MED ORDER — VITAMIN D 1000 UNITS PO TABS
2000.0000 [IU] | ORAL_TABLET | Freq: Every day | ORAL | Status: DC
  Administered 2016-01-30 – 2016-02-02 (×4): 2000 [IU] via ORAL
  Filled 2016-01-30 (×4): qty 2

## 2016-01-30 MED ORDER — FENTANYL CITRATE (PF) 100 MCG/2ML IJ SOLN
INTRAMUSCULAR | Status: DC | PRN
  Administered 2016-01-30: 100 ug via INTRAVENOUS
  Administered 2016-01-30: 50 ug via INTRAVENOUS
  Administered 2016-01-30 (×2): 100 ug via INTRAVENOUS
  Administered 2016-01-30 (×3): 50 ug via INTRAVENOUS

## 2016-01-30 MED ORDER — BUPIVACAINE HCL (PF) 0.25 % IJ SOLN
INTRAMUSCULAR | Status: AC
  Filled 2016-01-30: qty 30

## 2016-01-30 MED ORDER — SUGAMMADEX SODIUM 200 MG/2ML IV SOLN
INTRAVENOUS | Status: DC | PRN
Start: 2016-01-30 — End: 2016-01-30
  Administered 2016-01-30: 200 mg via INTRAVENOUS

## 2016-01-30 MED ORDER — LIDOCAINE HCL (CARDIAC) 20 MG/ML IV SOLN
INTRAVENOUS | Status: DC | PRN
  Administered 2016-01-30: 40 mg via INTRAVENOUS
  Administered 2016-01-30: 60 mg via INTRAVENOUS

## 2016-01-30 MED ORDER — TRANEXAMIC ACID 1000 MG/10ML IV SOLN
2000.0000 mg | INTRAVENOUS | Status: DC | PRN
  Administered 2016-01-30: 2000 mg via TOPICAL

## 2016-01-30 MED ORDER — PROPOFOL 10 MG/ML IV BOLUS
INTRAVENOUS | Status: DC | PRN
  Administered 2016-01-30: 140 mg via INTRAVENOUS

## 2016-01-30 MED ORDER — LACTATED RINGERS IV SOLN: INTRAVENOUS | Status: DC | PRN

## 2016-01-30 MED ORDER — ASPIRIN EC 325 MG PO TBEC
325.0000 mg | DELAYED_RELEASE_TABLET | Freq: Every day | ORAL | Status: DC
  Administered 2016-01-31 – 2016-02-02 (×3): 325 mg via ORAL
  Filled 2016-01-30 (×4): qty 1

## 2016-01-30 MED ORDER — ONDANSETRON HCL 4 MG/2ML IJ SOLN
INTRAMUSCULAR | Status: DC | PRN
  Administered 2016-01-30: 4 mg via INTRAVENOUS

## 2016-01-30 MED ORDER — LACTATED RINGERS IV SOLN
INTRAVENOUS | Status: DC
  Administered 2016-01-30 (×2): via INTRAVENOUS

## 2016-01-30 MED ORDER — METOCLOPRAMIDE HCL 5 MG PO TABS: 5.0000 mg | ORAL_TABLET | Freq: Three times a day (TID) | ORAL | Status: DC | PRN

## 2016-01-30 MED ORDER — ONDANSETRON HCL 4 MG PO TABS: 4.0000 mg | ORAL_TABLET | Freq: Four times a day (QID) | ORAL | Status: DC | PRN

## 2016-01-30 MED ORDER — BUPIVACAINE LIPOSOME 1.3 % IJ SUSP
20.0000 mL | Freq: Once | INTRAMUSCULAR | Status: DC
  Filled 2016-01-30: qty 20

## 2016-01-30 MED ORDER — ONDANSETRON HCL 4 MG/2ML IJ SOLN: 4.0000 mg | Freq: Four times a day (QID) | INTRAMUSCULAR | Status: DC | PRN

## 2016-01-30 MED ORDER — POTASSIUM CHLORIDE IN NACL 20-0.9 MEQ/L-% IV SOLN
INTRAVENOUS | Status: AC
  Administered 2016-01-30 – 2016-01-31 (×2): via INTRAVENOUS
  Filled 2016-01-30 (×2): qty 1000

## 2016-01-30 MED ORDER — EPHEDRINE SULFATE 50 MG/ML IJ SOLN
INTRAMUSCULAR | Status: DC | PRN
  Administered 2016-01-30: 5 mg via INTRAVENOUS
  Administered 2016-01-30 (×2): 10 mg via INTRAVENOUS
  Administered 2016-01-30: 5 mg via INTRAVENOUS

## 2016-01-30 MED ORDER — PROPOFOL 10 MG/ML IV BOLUS
INTRAVENOUS | Status: AC
  Filled 2016-01-30: qty 20

## 2016-01-30 SURGICAL SUPPLY — 61 items
BENZOIN TINCTURE PRP APPL 2/3 (GAUZE/BANDAGES/DRESSINGS) ×2 IMPLANT
BLADE SAW SGTL 18X1.27X75 (BLADE) ×2 IMPLANT
BLADE SURG ROTATE 9660 (MISCELLANEOUS) ×2 IMPLANT
CAPT HIP TOTAL 2 ×2 IMPLANT
CELLS DAT CNTRL 66122 CELL SVR (MISCELLANEOUS) ×1 IMPLANT
COVER PERINEAL POST (MISCELLANEOUS) ×2 IMPLANT
COVER SURGICAL LIGHT HANDLE (MISCELLANEOUS) ×2 IMPLANT
DECANTER SPIKE VIAL GLASS SM (MISCELLANEOUS) ×4 IMPLANT
DRAPE C-ARM 42X72 X-RAY (DRAPES) ×2 IMPLANT
DRAPE STERI IOBAN 125X83 (DRAPES) ×2 IMPLANT
DRAPE U-SHAPE 47X51 STRL (DRAPES) ×6 IMPLANT
DRSG AQUACEL AG ADV 3.5X10 (GAUZE/BANDAGES/DRESSINGS) ×2 IMPLANT
DURAPREP 26ML APPLICATOR (WOUND CARE) ×2 IMPLANT
ELECT BLADE 4.0 EZ CLEAN MEGAD (MISCELLANEOUS) ×2
ELECT BLADE 6.5 EXT (BLADE) IMPLANT
ELECT REM PT RETURN 9FT ADLT (ELECTROSURGICAL) ×2
ELECTRODE BLDE 4.0 EZ CLN MEGD (MISCELLANEOUS) ×1 IMPLANT
ELECTRODE REM PT RTRN 9FT ADLT (ELECTROSURGICAL) ×1 IMPLANT
FACESHIELD WRAPAROUND (MASK) ×4 IMPLANT
GLOVE BIOGEL PI IND STRL 7.5 (GLOVE) ×1 IMPLANT
GLOVE BIOGEL PI IND STRL 8 (GLOVE) ×2 IMPLANT
GLOVE BIOGEL PI INDICATOR 7.5 (GLOVE) ×1
GLOVE BIOGEL PI INDICATOR 8 (GLOVE) ×2
GLOVE ECLIPSE 7.0 STRL STRAW (GLOVE) ×2 IMPLANT
GLOVE ECLIPSE 8.0 STRL XLNG CF (GLOVE) ×2 IMPLANT
GLOVE ORTHO TXT STRL SZ7.5 (GLOVE) ×4 IMPLANT
GLOVE SURG SS PI 6.5 STRL IVOR (GLOVE) ×2 IMPLANT
GLOVE SURG SYN 7.5  E (GLOVE) ×4
GLOVE SURG SYN 7.5 E (GLOVE) ×4 IMPLANT
GOWN STRL REUS W/ TWL LRG LVL3 (GOWN DISPOSABLE) ×2 IMPLANT
GOWN STRL REUS W/ TWL XL LVL3 (GOWN DISPOSABLE) ×2 IMPLANT
GOWN STRL REUS W/TWL LRG LVL3 (GOWN DISPOSABLE) ×2
GOWN STRL REUS W/TWL XL LVL3 (GOWN DISPOSABLE) ×2
HANDPIECE INTERPULSE COAX TIP (DISPOSABLE) ×1
HOOD PEEL AWAY FACE SHEILD DIS (HOOD) ×4 IMPLANT
KIT BASIN OR (CUSTOM PROCEDURE TRAY) ×2 IMPLANT
KIT ROOM TURNOVER OR (KITS) ×2 IMPLANT
MANIFOLD NEPTUNE II (INSTRUMENTS) ×2 IMPLANT
NEEDLE SPNL 18GX3.5 QUINCKE PK (NEEDLE) ×2 IMPLANT
NS IRRIG 1000ML POUR BTL (IV SOLUTION) ×2 IMPLANT
PACK TOTAL JOINT (CUSTOM PROCEDURE TRAY) ×2 IMPLANT
PAD ARMBOARD 7.5X6 YLW CONV (MISCELLANEOUS) ×2 IMPLANT
PEN SKIN MARKING BROAD (MISCELLANEOUS) ×2 IMPLANT
RTRCTR WOUND ALEXIS 18CM MED (MISCELLANEOUS) ×2
SEALER BIPOLAR AQUA 6.0 (INSTRUMENTS) ×2 IMPLANT
SET HNDPC FAN SPRY TIP SCT (DISPOSABLE) ×1 IMPLANT
STRIP CLOSURE SKIN 1/2X4 (GAUZE/BANDAGES/DRESSINGS) ×4 IMPLANT
SUT ETHIBOND 2 V 37 (SUTURE) ×2 IMPLANT
SUT ETHIBOND NAB CT1 #1 30IN (SUTURE) ×2 IMPLANT
SUT MNCRL AB 4-0 PS2 18 (SUTURE) ×2 IMPLANT
SUT VIC AB 0 CT1 27 (SUTURE) ×1
SUT VIC AB 0 CT1 27XBRD ANBCTR (SUTURE) ×1 IMPLANT
SUT VIC AB 1 CT1 27 (SUTURE) ×1
SUT VIC AB 1 CT1 27XBRD ANBCTR (SUTURE) ×1 IMPLANT
SUT VIC AB 2-0 CT1 27 (SUTURE) ×1
SUT VIC AB 2-0 CT1 TAPERPNT 27 (SUTURE) ×1 IMPLANT
SYRINGE 60CC LL (MISCELLANEOUS) ×2 IMPLANT
TOWEL OR 17X24 6PK STRL BLUE (TOWEL DISPOSABLE) ×2 IMPLANT
TOWEL OR 17X26 10 PK STRL BLUE (TOWEL DISPOSABLE) ×2 IMPLANT
TRAY CATH 16FR W/PLASTIC CATH (SET/KITS/TRAYS/PACK) ×2 IMPLANT
TUBE SUCT ARGYLE STRL (TUBING) ×2 IMPLANT

## 2016-01-30 NOTE — Op Note (Signed)
NAMESEVRIN, SALLY NO.:  [id number]  MEDICAL RECORD NO.:  [id number]  LOCATION:  5N23C                        FACILITY:  [facility]  PHYSICIAN:  Burnard Bunting, M.D.    DATE OF BIRTH:  [birth year]  DATE OF PROCEDURE: DATE OF DISCHARGE:                              OPERATIVE REPORT   PREOPERATIVE DIAGNOSIS:  Left hip arthritis.  POSTOPERATIVE DIAGNOSIS:  Left hip arthritis.  PROCEDURE:  Left total hip replacement.  SURGEON:  Burnard Bunting, M.D.  ASSISTANT:  Earna Coder, MD.  SECOND ASSISTANT:  Patrick Jupiter, RNFA.  INDICATIONS:  Bernabe Dorce is a 50 year old patient with left hip arthritis, end-stage with failure of conservative management.  He presents for operative management after explanation of risks and benefits.  COMPONENTS UTILIZED:  Corail stem press-fit, size 13, 56 Pinnacle cup, no screws, 4-mm offset liner, 1.5 ceramic head.  ESTIMATED BLOOD LOSS:  650.  PROCEDURE IN DETAIL:  The patient was brought to the operating room where general endotracheal anesthesia was induced, preoperative antibiotics were administered.  Time-out was called.  The patient was placed on the Hana bed where preoperative AP pelvis and AP left hip were obtained.  The patient was then placed on the Hana bed.  Left hip was prescrubbed with alcohol and Betadine and allowed to air dry, prepped with DuraPrep solution and draped in a sterile manner.  [name] was used to cover the operative field.  Time-out was called.  A 12-cm incision was made 3 cm posterior and 1 cm distal to the anterior superior iliac crest on the left-hand side.  Skin and subcutaneous tissue were sharply divided.  Fascia overlying the tensor fascia lata was incised at its midportion up to the anterior superior iliac crest.  Finger sweeping performed.  Fascia lata mobilized laterally.  Cobra retractor placed superior femoral neck.  Two heads placed and cautery was performed of the circumflex vessels.   Following this, Cobb elevator was used to lift up the reflected head of the rectus off the anterior femoral neck. Second Cobra retractor placed in this area.  Capsulotomy was performed beginning up at the acetabulum and the labrum anterolaterally extending distally to the trochanteric ridge and then cutting down towards point about a fingerbreadth above the lesser trochanter.  The capsule was tagged on both sides.  Capsule was released down with external rotation around the posterior femoral neck.  At this time, with preoperative templating performed and with fluoroscopy, the neck cut was made.  Head was removed and sized to a 56.  At this time, acetabular retractors were carefully placed and the labrum was excised.  Pulvinar from within the fovea excised.  Under fluoroscopic guidance, reaming was performed up to size 56 with excellent purchase obtained.  The inferior aspect of the cup was proximally labeled with the bottom of the tear drop, some medialization was performed, but not excessively.  The cup was placed in about 45 degrees of abduction and about 10 degrees of anteversion. Excellent press-fit was obtained.  At this point in time, attention was directed towards the femur.  The femoral lift was placed around the femur.  The inferior capsule was released in order to mobilize the  femur laterally with trochanteric retractor placed.  The conjoint tendon was released and the foot was taken down and under with about 130 of external rotation and extension and adduction.  The femoral lift was engaged.  This facilitated further femoral exposure.  Lateralization was performed.  Broaching was then performed after box cutter and chili pepper used to become lateral in the femur.  Version was parallel to the posterior cortex of the cut end of the distal femur.  With 13 trial in place, fluoroscopy demonstrated good fill of the canal and reduction was performed with a 1.5 ball.  This was slightly  loose, but was stable with 50 degrees of extension and 90 degrees of external rotation.  Trial component removed on the femur, true component placed, size 13, which actually stayed about 3 mm proximal to the calcar.  This gave an excellent fit with equal leg lengths using the 1.5 ball.  Ceramic ball was utilized.  The patient was stable with both external rotation and internal rotation.  Leg lengths approximately equal.  Thorough irrigation performed.  Tranexamic acid injected.  Capsule closed. Fascia lata closed using #1 Vicryl suture followed by interrupted inverted 0 Vicryl suture, 2-0 Vicryl suture and a Monocryl with Aquacel dressing placed.  Leg lengths approximately equal at the conclusion of the case after he was moved to the bed.  Approximately 5 liters of irrigating solution was utilized during and after placement of the components.     Burnard BuntingG. Scott Mccall Lomax, M.D.     GSD/MEDQ  D:  01/30/2016  T:  01/30/2016  Job:  [id number]

## 2016-01-30 NOTE — H&P (Signed)
TOTAL HIP ADMISSION H&P  Patient is admitted for left total hip arthroplasty.  Subjective:  Chief Complaint: left hip pain  HPI: Chad Wagner, 50 y.o. male, has a history of pain and functional disability in the left hip(s) due to arthritis and patient has failed non-surgical conservative treatments for greater than 12 weeks to include NSAID's and/or analgesics, use of assistive devices and activity modification.  Onset of symptoms was gradual starting 7 years ago with gradually worsening course since that time.The patient noted no past surgery on the left hip(s).  Patient currently rates pain in the left hip at 9 out of 10 with activity. Patient has night pain, worsening of pain with activity and weight bearing, trendelenberg gait, pain that interfers with activities of daily living, pain with passive range of motion and crepitus. Patient has evidence of subchondral sclerosis, periarticular osteophytes and joint space narrowing by imaging studies. This condition presents safety issues increasing the risk of falls. 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  There is no current active infection.  There are no active problems to display for this patient.  Past Medical History  Diagnosis Date  . Anger   . Varicose vein of leg   . Asthma     MILD  DUE TO ALLERGIES   (RESOLVED)  . Depression     Past Surgical History  Procedure Laterality Date  . Varicose vein surgery      1993  . Vasectomy      2010  . Tonsillectomy      T+A 1980  . Appendectomy      1985  . Shoulder arthroscopy with open rotator cuff repair and distal clavicle acrominectomy Right 11/02/2015    Procedure: SHOULDER DIAGNOSTIC OPERATIVE ARTHROSCOPY WITH MINI-OPEN ROTATOR CUFF REPAIR, DISTAL CLAVICLE EXCISION,  BICEPS TENODESIS.  ;  Surgeon: Cammy Copa, MD;  Location: [facility] OR;  Service: Orthopedics;  Laterality: Right;     No prescriptions prior to admission   Allergies  Allergen Reactions  . Penicillins Anaphylaxis and Swelling    Noted 01/2013 - swollen gland in tongue  Has patient had a PCN reaction causing immediate rash, facial/tongue/throat swelling, SOB or lightheadedness with hypotension: yes Has patient had a PCN reaction causing severe rash involving mucus membranes or skin necrosis: No Has patient had a PCN reaction that required hospitalization No Has patient had a PCN reaction occurring within the last 10 years: No If all of the above answers are "NO", then may proceed with Cephalosporin use.     Social History  Substance Use Topics  . 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 Smokeless tobacco: Not on file  . Alcohol Use: 0.0 oz/week    4-5 Glasses of wine per week    No family history on file.   Review of Systems  Constitutional: Negative.   HENT: Negative.   Eyes: Negative.   Respiratory: Negative.   Cardiovascular: Negative.   Gastrointestinal: Negative.   Genitourinary: Negative.   Musculoskeletal: Positive for joint pain.  Skin: Negative.   Neurological: Negative.   Endo/Heme/Allergies: Negative.   Psychiatric/Behavioral: Negative.     Objective:  Physical Exam  Constitutional: He appears well-developed.  HENT:  Head: Normocephalic.  Eyes: Pupils are equal, round, and reactive to light.  Neck: Normal range of motion.  Cardiovascular: Normal rate.   Respiratory: Effort normal.  Neurological: He is alert.  Skin: Skin is  warm.  Psychiatric: He has a normal mood and affect.  Examination the left tibia demonstrates equal leg lengths palpable pedal pulses good hip flexion abduction and external rotation strength pain with internal/external rotation of the left hip and to a lesser degree the right hip pedal pulses palpable reflexes symmetric ankle dorsi and plantar flexion intact with good sensation on the dorsal plantar aspect of the foot  Vital signs in last 24 hours:     Labs:   Estimated body mass index is 35.84 kg/(m^2) as calculated from the following:   Height as of 11/02/15: 5' 10.5" (1.791 m).   Weight as of 10/27/15: 114.958 kg (253 lb 7 oz).   Imaging Review Plain radiographs demonstrate moderate degenerative joint disease of the left hip(s). The bone quality appears to be good for age and reported activity level.  Assessment/Plan:  End stage arthritis, left hip(s)  The patient history, physical examination, clinical judgement of the provider and imaging studies are consistent with end stage degenerative joint disease of the left hip(s) and total hip arthroplasty is deemed medically necessary. The treatment options including medical management, injection therapy, arthroscopy and arthroplasty were discussed at length. The risks and benefits of total hip arthroplasty were presented and reviewed. The risks due to aseptic loosening, infection, stiffness, dislocation/subluxation,  thromboembolic complications and other imponderables were discussed.  The patient acknowledged the explanation, agreed to proceed with the plan and consent was signed. Patient is being admitted for inpatient treatment for surgery, pain control, PT, OT, prophylactic antibiotics, VTE prophylaxis, progressive ambulation and ADL's and discharge planning.The patient is planning to be discharged home with home health services

## 2016-01-30 NOTE — Brief Op Note (Signed)
01/30/2016  7:33 PM  PATIENT:  Chad Wagner  50 y.o. male  PRE-OPERATIVE DIAGNOSIS:  left hip osteoarthritis  POST-OPERATIVE DIAGNOSIS:  left hip osteoarthritis  PROCEDURE:  Procedure(s): LEFT TOTAL HIP ARTHROPLASTY ANTERIOR APPROACH  SURGEON:  August Saucerean md and Roda ShuttersXu md  ASSISTANT: Bethune rnfa  ANESTHESIA:   general  EBL: 650 ml       BLOOD ADMINISTERED: none  DRAINS: none   LOCAL MEDICATIONS USED:  exparel and txa  SPECIMEN:  No Specimen  COUNTS:  YES  TOURNIQUET:  * No tourniquets in log *  DICTATION: .Other Dictation: Dictation Number [id number]  PLAN OF CARE: Admit to inpatient   PATIENT DISPOSITION:  PACU - hemodynamically stable

## 2016-01-30 NOTE — Anesthesia Preprocedure Evaluation (Signed)
Anesthesia Evaluation  Patient identified by MRN, date of birth, ID band Patient awake    Reviewed: Allergy & Precautions, NPO status , Patient's Chart, lab work & pertinent test results  Airway Mallampati: II   Neck ROM: full    Dental   Pulmonary asthma , former smoker,    breath sounds clear to auscultation       Cardiovascular + Peripheral Vascular Disease  negative cardio ROS   Rhythm:regular Rate:Normal     Neuro/Psych Depression    GI/Hepatic   Endo/Other  obese  Renal/GU      Musculoskeletal   Abdominal   Peds  Hematology   Anesthesia Other Findings   Reproductive/Obstetrics                             Anesthesia Physical Anesthesia Plan  ASA: II  Anesthesia Plan: General   Post-op Pain Management:    Induction: Intravenous  Airway Management Planned: Oral ETT  Additional Equipment:   Intra-op Plan:   Post-operative Plan: Extubation in OR  Informed Consent: I have reviewed the patients History and Physical, chart, labs and discussed the procedure including the risks, benefits and alternatives for the proposed anesthesia with the patient or authorized representative who has indicated his/her understanding and acceptance.     Plan Discussed with: CRNA, Anesthesiologist and Surgeon  Anesthesia Plan Comments:         Anesthesia Quick Evaluation

## 2016-01-30 NOTE — Transfer of Care (Signed)
Immediate Anesthesia Transfer of Care Note  Patient: Chad Wagner  Procedure(s) Performed: Procedure(s): LEFT TOTAL HIP ARTHROPLASTY ANTERIOR APPROACH (Left)  Patient Location: PACU  Anesthesia Type:General  Level of Consciousness: awake, oriented, sedated, patient cooperative and responds to stimulation  Airway & Oxygen Therapy: Patient Spontanous Breathing and Patient connected to nasal cannula oxygen  Post-op Assessment: Report given to RN, Post -op Vital signs reviewed and stable, Patient moving all extremities and Patient moving all extremities X 4  Post vital signs: Reviewed and stable  Last Vitals:  Filed Vitals:   01/30/16 1127 01/30/16 1916  BP: 135/87 134/78  Pulse: 63 98  Temp: 36.8 C 36.8 C  Resp: 20 15    Complications: No apparent anesthesia complications

## 2016-01-30 NOTE — Anesthesia Postprocedure Evaluation (Signed)
Anesthesia Post Note  Patient: Chad Wagner  Procedure(s) Performed: Procedure(s) (LRB): LEFT TOTAL HIP ARTHROPLASTY ANTERIOR APPROACH (Left)  Patient location during evaluation: PACU Anesthesia Type: General Level of consciousness: awake, awake and alert and oriented Pain management: pain level controlled Respiratory status: spontaneous breathing Cardiovascular status: blood pressure returned to baseline Anesthetic complications: no    Last Vitals:  Filed Vitals:   01/30/16 1945 01/30/16 2116  BP: 121/66 123/66  Pulse: 96 105  Temp:  36.8 C  Resp: 22 20    Last Pain:  Filed Vitals:   01/30/16 2117  PainSc: 4                  Caydee Talkington COKER

## 2016-01-30 NOTE — Anesthesia Procedure Notes (Signed)
Procedure Name: Intubation Date/Time: 01/30/2016 3:38 PM Performed by: Adonis HousekeeperNGELL, [name] Pre-anesthesia Checklist: Patient identified, Emergency Drugs available, Suction available and Patient being monitored Patient Re-evaluated:Patient Re-evaluated prior to inductionOxygen Delivery Method: Circle system utilized Preoxygenation: Pre-oxygenation with 100% oxygen Intubation Type: IV induction Ventilation: Mask ventilation without difficulty, Oral airway inserted - appropriate to patient size and Two handed mask ventilation required Laryngoscope Size: Mac and 3 Grade View: Grade II Tube type: Oral Tube size: 7.5 mm Number of attempts: 1 Airway Equipment and Method: Stylet Placement Confirmation: positive ETCO2,  breath sounds checked- equal and bilateral and ETT inserted through vocal cords under direct vision Secured at: 23 cm Tube secured with: Tape Dental Injury: Teeth and Oropharynx as per pre-operative assessment

## 2016-01-31 ENCOUNTER — Encounter (HOSPITAL_COMMUNITY): Payer: Self-pay | Admitting: Orthopedic Surgery

## 2016-01-31 LAB — BASIC METABOLIC PANEL
Anion gap: 9 (ref 5–15)
BUN: 12 mg/dL (ref 6–20)
CALCIUM: 8.2 mg/dL — AB (ref 8.9–10.3)
CHLORIDE: 102 mmol/L (ref 101–111)
CO2: 26 mmol/L (ref 22–32)
CREATININE: 0.88 mg/dL (ref 0.61–1.24)
Glucose, Bld: 160 mg/dL — ABNORMAL HIGH (ref 65–99)
POTASSIUM: 4 mmol/L (ref 3.5–5.1)
SODIUM: 137 mmol/L (ref 135–145)

## 2016-01-31 LAB — CBC
HEMATOCRIT: 33.3 % — AB (ref 39.0–52.0)
HEMOGLOBIN: 10.9 g/dL — AB (ref 13.0–17.0)
MCH: 29.5 pg (ref 26.0–34.0)
MCHC: 32.7 g/dL (ref 30.0–36.0)
MCV: 90.2 fL (ref 78.0–100.0)
Platelets: 129 10*3/uL — ABNORMAL LOW (ref 150–400)
RBC: 3.69 MIL/uL — AB (ref 4.22–5.81)
RDW: 13.6 % (ref 11.5–15.5)
WBC: 7.3 10*3/uL (ref 4.0–10.5)

## 2016-01-31 MED ORDER — ZOLPIDEM TARTRATE 5 MG PO TABS
5.0000 mg | ORAL_TABLET | Freq: Once | ORAL | Status: AC
  Administered 2016-01-31: 5 mg via ORAL
  Filled 2016-01-31: qty 1

## 2016-01-31 MED ORDER — ZOLPIDEM TARTRATE 5 MG PO TABS
2.5000 mg | ORAL_TABLET | Freq: Every evening | ORAL | Status: AC | PRN
  Administered 2016-02-01: 2.5 mg via ORAL
  Filled 2016-01-31: qty 1

## 2016-01-31 NOTE — Care Management Note (Signed)
Case Management Note  Patient Details  Name: Chad Wagner MRN: [id number] Date of Birth: 01/19/1966  Subjective/Objective:         S/p total hip arthroplasty           Action/Plan[facility] MD office.Spoke with patient and his wife, no change in discharge plan. Wife will be able to assist after discharge. Contacted [facility] for equipment Berkley Harveyauth, given auth # [id number] and [facility] Spoke with Jermaine at [facility] , gave auth #, they will deliver 3N1 and rolling walker to patient's room.    Expected Discharge Date:                  Expected Discharge Plan:  Home w Home Health Services  In-House Referral:  NA  Discharge planning Services  CM Consult  Post Acute Care Choice:  Durable Medical Equipment, Home Health Choice offered to:  Patient  DME Arranged:  Walker rolling, 3-N-1 DME Agency:  [facility]  HH Arranged:  [facility]  Status of Service:  Completed, signed off  Medicare Important Message Given:    Date Medicare IM Given:    Medicare IM give by:    Date Additional Medicare IM Given:    Additional Medicare Important Message give by:     If discussed at Long Length of Stay Meetings, dates discussed:    Additional Comments:  Chad Wagner, Chad Villena Watson, RN 01/31/2016, 11:45 AM

## 2016-01-31 NOTE — Clinical Social Work Note (Signed)
CSW received referral for SNF.  Case discussed with case manager, and plan is to discharge home.  CSW to sign off please re-consult if social work needs arise.  Haislee Corso R. Dilraj Killgore, MSW, LCSWA [phone]  

## 2016-01-31 NOTE — Progress Notes (Signed)
Physical Therapy Treatment Patient Details Name: Chad LevineMichael I [place] MRN: [id number] DOB: 03/28/1966 Today's Date: 01/31/2016    History of Present Illness L THR    PT Comments    Pt s/p L THR presents with decreased L LE strength/ROM and post op pain limiting functional mobility.  Pt should progress to dc home with family assist and HHPT follow up.  Follow Up Recommendations  Home health PT     Equipment Recommendations  Rolling walker with 5" wheels    Recommendations for Other Services OT consult     Precautions / Restrictions Precautions Precautions: Fall Restrictions Weight Bearing Restrictions: No LLE Weight Bearing: Weight bearing as tolerated    Mobility  Bed Mobility Overal bed mobility: Needs Assistance Bed Mobility: Supine to Sit     Supine to sit: Min assist     General bed mobility comments: cues for sequence and use of UEs to self assist  Transfers Overall transfer level: Needs assistance Equipment used: Rolling walker (2 wheeled) Transfers: Sit to/from Stand Sit to Stand: Min assist         General transfer comment: cues for LE managment and use of UEs to self assist  Ambulation/Gait Ambulation/Gait assistance: Min assist;Min guard Ambulation Distance (Feet): 64 Feet Assistive device: Rolling walker (2 wheeled) Gait Pattern/deviations: Step-to pattern;Decreased step length - right;Decreased step length - left;Shuffle;Trunk flexed Gait velocity: decr Gait velocity interpretation: Below normal speed for age/gender General Gait Details: cues for sequence, posture and position from [employer]   Stairs            Wheelchair Mobility    Modified Rankin (Stroke Patients Only)       Balance                                    Cognition Arousal/Alertness: Awake/alert Behavior During Therapy: WFL for tasks assessed/performed Overall Cognitive Status: Within Functional Limits for tasks assessed                       Exercises Total Joint Exercises Ankle Circles/Pumps: AROM;15 reps;Supine;Both Quad Sets: AROM;Both;10 reps;Supine Heel Slides: AAROM;Left;15 reps;Supine Hip ABduction/ADduction: AAROM;Left;10 reps;Supine    General Comments        Pertinent Vitals/Pain Pain Assessment: 0-10 Pain Score: 7  Pain Location: L hip and upper thigh Pain Descriptors / Indicators: Aching;Burning Pain Intervention(s): Limited activity within patient's tolerance;Monitored during session;Premedicated before session;Ice applied    Home Living Family/patient expects to be discharged to:: Private residence Living Arrangements: Spouse/significant other Available Help at Discharge: Family Type of Home: House Home Access: Stairs to enter Entrance Stairs-Rails: Right Home Layout: One level Home Equipment: None      Prior Function Level of Independence: Independent      Comments: 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 [employer]   PT Goals (current goals can now be found in the care plan section) Acute Rehab PT Goals Patient Stated Goal: Regain IND and return to work ASAP PT Goal Formulation: With patient Time For Goal Achievement: 02/03/16 Potential to Achieve Goals: Good    Frequency  7X/week    PT Plan      Co-evaluation             End of Session Equipment Utilized During Treatment: Gait belt Activity Tolerance: Patient tolerated treatment well Patient left: in chair;with call bell/phone within reach;with family/visitor present     Time: [id number] PT Time Calculation (  min) (ACUTE ONLY): 34 min  Charges:  $Therapeutic Exercise: 8-22 mins                    G Codes:      Kree Armato Feb 24, 2016, 12:35 PM

## 2016-01-31 NOTE — Progress Notes (Signed)
Subjective: Pt stable   Objective: Vital signs in last 24 hours: Temp:  [98.2 F (36.8 C)-98.5 F (36.9 C)] 98.5 F (36.9 C) (04/26 0410) Pulse Rate:  [63-105] 99 (04/26 0410) Resp:  [15-22] 20 (04/26 0410) BP: (115-135)/(63-94) 115/63 mmHg (04/26 0410) SpO2:  [97 %-100 %] 97 % (04/26 0410) Weight:  [112.492 kg (248 lb)-114.165 kg (251 lb 11 oz)] 112.492 kg (248 lb) (04/25 1223)  Intake/Output from previous day: 04/25 0701 - 04/26 0700 In: 3000 [I.V.:2000; IV Piggyback:1000] Out: 1950 [Urine:950; Blood:1000] Intake/Output this shift:    Exam:  Intact pulses distally Dorsiflexion/Plantar flexion intact  Labs:  Recent Labs  01/31/16 0336  HGB 10.9*    Recent Labs  01/31/16 0336  WBC 7.3  RBC 3.69*  HCT 33.3*  PLT 129*    Recent Labs  01/31/16 0336  NA 137  K 4.0  CL 102  CO2 26  BUN 12  CREATININE 0.88  GLUCOSE 160*  CALCIUM 8.2*   No results for input(s): LABPT, INR in the last 72 hours.  Assessment/Plan: Plan PT today - dc am   DEAN,GREGORY SCOTT 01/31/2016, 7:14 AM

## 2016-01-31 NOTE — Progress Notes (Signed)
Physical Therapy Treatment Patient Details Name: Chad Wagner MRN: [id number] DOB: 1965/12/25 Today's Date: 01/31/2016    History of Present Illness L THR    PT Comments    Good progress with mobility - ltd by WB tolerance 2* c/o burning sensation L hip/thigh.  Follow Up Recommendations  Home health PT     Equipment Recommendations  Rolling walker with 5" wheels    Recommendations for Other Services OT consult     Precautions / Restrictions Precautions Precautions: Fall Restrictions Weight Bearing Restrictions: No LLE Weight Bearing: Weight bearing as tolerated    Mobility  Bed Mobility Overal bed mobility: Needs Assistance Bed Mobility: Supine to Sit;Sit to Supine     Supine to sit: Min assist Sit to supine: Min assist   General bed mobility comments: cues for sequence and use of R LE to self assist  Transfers Overall transfer level: Needs assistance Equipment used: Rolling walker (2 wheeled) Transfers: Sit to/from Stand Sit to Stand: Min guard         General transfer comment: Ltd WB tolerance 2* c/o burning sensation.  Cues for LE managment and use of UEs to self assist  Ambulation/Gait Ambulation/Gait assistance: Min assist;Min guard Ambulation Distance (Feet): 150 Feet Assistive device: Rolling walker (2 wheeled) Gait Pattern/deviations: Step-to pattern;Decreased step length - right;Decreased step length - left;Decreased stance time - left;Shuffle;Antalgic Gait velocity: decr Gait velocity interpretation: Below normal speed for age/gender General Gait Details: cues for sequence, posture and position from RW   Stairs            Wheelchair Mobility    Modified Rankin (Stroke Patients Only)       Balance                                    Cognition Arousal/Alertness: Awake/alert Behavior During Therapy: WFL for tasks assessed/performed Overall Cognitive Status: Within Functional Limits for tasks assessed                       Exercises Total Joint Exercises Ankle Circles/Pumps: AROM;15 reps;Supine;Both Quad Sets: AROM;Both;10 reps;Supine Heel Slides: AAROM;Left;15 reps;Supine Hip ABduction/ADduction: AAROM;Left;10 reps;Supine    General Comments        Pertinent Vitals/Pain Pain Assessment: 0-10 Pain Score: 5  Pain Location: L hip and upper thigh Pain Descriptors / Indicators: Aching;Burning Pain Intervention(s): Limited activity within patient's tolerance;Monitored during session;Premedicated before session;Ice applied    Home Living                      Prior Function            PT Goals (current goals can now be found in the care plan section) Acute Rehab PT Goals Patient Stated Goal: Regain IND and return to work ASAP PT Goal Formulation: With patient Time For Goal Achievement: 02/03/16 Potential to Achieve Goals: Good Progress towards PT goals: Progressing toward goals    Frequency  7X/week    PT Plan Current plan remains appropriate    Co-evaluation             End of Session Equipment Utilized During Treatment: Gait belt Activity Tolerance: Patient tolerated treatment well Patient left: in bed;with call bell/phone within reach;with family/visitor present     Time: 4098-1191 PT Time Calculation (min) (ACUTE ONLY): 42 min  Charges:  $Gait Training: 23-37 mins $Therapeutic Exercise: 8-22 mins  G Codes:      Chad Wagner 01/31/2016, 3:51 PM

## 2016-02-01 LAB — CBC
HCT: 33.8 % — ABNORMAL LOW (ref 39.0–52.0)
Hemoglobin: 11.3 g/dL — ABNORMAL LOW (ref 13.0–17.0)
MCH: 30.5 pg (ref 26.0–34.0)
MCHC: 33.4 g/dL (ref 30.0–36.0)
MCV: 91.1 fL (ref 78.0–100.0)
PLATELETS: 120 10*3/uL — AB (ref 150–400)
RBC: 3.71 MIL/uL — ABNORMAL LOW (ref 4.22–5.81)
RDW: 13.6 % (ref 11.5–15.5)
WBC: 7.7 10*3/uL (ref 4.0–10.5)

## 2016-02-01 NOTE — Progress Notes (Signed)
Physical Therapy Treatment Patient Details Name: Chad LevineMichael I [place] MRN: [id number] DOB: 09/17/1966 Today's Date: 02/01/2016    History of Present Illness L THR    PT Comments    Initiated stair training and educated pt and wife.  Could benefit from another session in AM, as he was extremely fatigued with 2 steps today.  Con't to recommend HHPT.    Follow Up Recommendations  Home health PT     Equipment Recommendations  Rolling walker with 5" wheels (has been delivered)    Recommendations for Other Services       Precautions / Restrictions Precautions Precautions: Fall Restrictions LLE Weight Bearing: Weight bearing as tolerated    Mobility  Bed Mobility Overal bed mobility: Needs Assistance Bed Mobility: Supine to Sit     Supine to sit: Min guard Sit to supine: Min assist   General bed mobility comments: Used gait belt as leg lifter and educated pt and wife  Transfers Overall transfer level: Needs assistance Equipment used: Rolling walker (2 wheeled) Transfers: Sit to/from Stand Sit to Stand: Supervision;Min guard         General transfer comment: Pt able to stand with S and sit with min/guard and cues  Ambulation/Gait Ambulation/Gait assistance: Min guard Ambulation Distance (Feet): 20 Feet Assistive device: Rolling walker (2 wheeled) Gait Pattern/deviations: Step-through pattern Gait velocity: decr Gait velocity interpretation: Below normal speed for age/gender General Gait Details: Pt ambulated to bathroom and then to recliner.  Cues for heel down.   Stairs Stairs: Yes Stairs assistance: Min guard Stair Management: One rail Right;Forwards;With cane;Step to pattern Number of Stairs: 2 General stair comments: Pt and wife educated on stair management. Pt appeared nervous about them, but did with min/guard.  Very fatigued after gait.  Wheelchair Mobility    Modified Rankin (Stroke Patients Only)       Balance Overall balance assessment: No  apparent balance deficits (not formally assessed)                                  Cognition Arousal/Alertness: Awake/alert Behavior During Therapy: WFL for tasks assessed/performed Overall Cognitive Status: Within Functional Limits for tasks assessed                      Exercises Total Joint Exercises Ankle Circles/Pumps: AROM;15 reps;Supine;Both Quad Sets: AROM;Left;10 reps Heel Slides: AAROM;Left;10 reps Hip ABduction/ADduction: AAROM;Left;10 reps    General Comments        Pertinent Vitals/Pain Pain Assessment: Faces Pain Score: 6  Faces Pain Scale: Hurts even more Pain Location: L hip Pain Descriptors / Indicators: Aching;Operative site guarding Pain Intervention(s): Premedicated before session;Limited activity within patient's tolerance;Repositioned    Home Living                      Prior Function            PT Goals (current goals can now be found in the care plan section) Acute Rehab PT Goals Patient Stated Goal: Regain IND and return to work ASAP PT Goal Formulation: With patient Time For Goal Achievement: 02/03/16 Potential to Achieve Goals: Good Progress towards PT goals: Progressing toward goals    Frequency  7X/week    PT Plan Current plan remains appropriate    Co-evaluation             End of Session Equipment Utilized During Treatment: Gait belt Activity Tolerance:  Patient tolerated treatment well Patient left: in chair;with call bell/phone within reach;with family/visitor present     Time: 1610-9604 PT Time Calculation (min) (ACUTE ONLY): 33 min  Charges:  $Gait Training: 8-22 mins $Therapeutic Exercise: 8-22 mins                    G Codes:      Navina Wohlers LUBECK 02/01/2016, 2:31 PM

## 2016-02-01 NOTE — Progress Notes (Signed)
Physical Therapy Treatment Patient Details Name: SILVESTER REIERSON MRN: [id number] DOB: [birth year] Today's Date: 02/01/2016    History of Present Illness L THR    PT Comments    Pt able to increase distance and increase WB during session today, but declined therex at end due to pain and fatigue.  Verbally reviewed therex.  Pt's RW had been delivered with wheels on the inside of the RW.  Switched wheels so they were on the outside.  Discussed stair training and will work on this next session.  Follow Up Recommendations  Home health PT     Equipment Recommendations  Rolling walker with 5" wheels (has been delivered)    Recommendations for Other Services       Precautions / Restrictions Precautions Precautions: Fall Restrictions LLE Weight Bearing: Weight bearing as tolerated    Mobility  Bed Mobility Overal bed mobility: Needs Assistance Bed Mobility: Supine to Sit     Supine to sit: Min assist Sit to supine: Min assist   General bed mobility comments: A for L LE  Transfers Overall transfer level: Needs assistance Equipment used: Rolling walker (2 wheeled) Transfers: Sit to/from Stand Sit to Stand: Min guard         General transfer comment: Min/guard for steadying  Ambulation/Gait Ambulation/Gait assistance: Min guard Ambulation Distance (Feet): 200 Feet Assistive device: Rolling walker (2 wheeled) Gait Pattern/deviations: Step-through pattern;Decreased step length - left;Decreased step length - right;Antalgic Gait velocity: decr Gait velocity interpretation: Below normal speed for age/gender General Gait Details: Cues for heel to toe pattern and UE to A with pain with WB at end of gait   Stairs            Wheelchair Mobility    Modified Rankin (Stroke Patients Only)       Balance Overall balance assessment: No apparent balance deficits (not formally assessed)                                  Cognition Arousal/Alertness:  Awake/alert Behavior During Therapy: WFL for tasks assessed/performed Overall Cognitive Status: Within Functional Limits for tasks assessed                      Exercises      General Comments        Pertinent Vitals/Pain Pain Assessment: 0-10 Pain Score: 6  Pain Location: L hip Pain Descriptors / Indicators: Aching Pain Intervention(s): Limited activity within patient's tolerance;Monitored during session;Premedicated before session    Home Living Family/patient expects to be discharged to:: Private residence Living Arrangements: Spouse/significant other Available Help at Discharge: Family Type of Home: House         Additional Comments: 3:1 and walker delivered to room    Prior Function Level of Independence: Independent      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[employer]   PT Goals (current goals can now be found in the care plan section) Acute Rehab PT Goals Patient Stated Goal: Regain IND and return to work ASAP PT Goal Formulation: With patient Time For Goal Achievement: 02/03/16 Potential to Achieve Goals: Good Progress towards PT goals: Progressing toward goals    Frequency  7X/week    PT Plan Current plan remains appropriate    Co-evaluation             End of Session Equipment Utilized During Treatment: Gait belt Activity Tolerance: Patient tolerated treatment well  Patient left: in bed;with call bell/phone within reach;with family/visitor present     Time: 1610-96041046-1109 PT Time Calculation (min) (ACUTE ONLY): 23 min  Charges:  $Gait Training: 23-37 mins                    G Codes:      Heraclio Seidman LUBECK 02/01/2016, 11:33 AM

## 2016-02-01 NOTE — Progress Notes (Signed)
Subjective: Pt stable - mobility better   Objective: Vital signs in last 24 hours: Temp:  [99.3 F (37.4 C)-99.4 F (37.4 C)] 99.3 F (37.4 C) (04/27 0557) Pulse Rate:  [84-91] 84 (04/27 0557) Resp:  [18-20] 20 (04/27 0557) BP: (109-121)/(61-65) 116/64 mmHg (04/27 0557) SpO2:  [96 %-100 %] 96 % (04/27 0557)  Intake/Output from previous day: 04/26 0701 - 04/27 0700 In: 2748.3 [P.O.:1150; I.V.:1598.3] Out: 1225 [Urine:1225] Intake/Output this shift:    Exam:  Intact pulses distally Dorsiflexion/Plantar flexion intact  Labs:  Recent Labs  01/31/16 0336 02/01/16 0543  HGB 10.9* 11.3*    Recent Labs  01/31/16 0336 02/01/16 0543  WBC 7.3 7.7  RBC 3.69* 3.71*  HCT 33.3* 33.8*  PLT 129* 120*    Recent Labs  01/31/16 0336  NA 137  K 4.0  CL 102  CO2 26  BUN 12  CREATININE 0.88  GLUCOSE 160*  CALCIUM 8.2*   No results for input(s): LABPT, INR in the last 72 hours.  Assessment/Plan: Plan dc tomorrow after more pt today   Levy Wellman SCOTT 02/01/2016, 8:28 AM

## 2016-02-01 NOTE — Evaluation (Signed)
Occupational Therapy Evaluation Patient Details Name: Chad Wagner MRN: [id number] DOB: Jun 19, 1966 Today's Date: 02/01/2016    History of Present Illness L THR   Clinical Impression   This 50 year old man was admitted for the above surgery, direct anterior approach.  He will benefit from one more OT session prior to discharge, to educate wife and answer any questions.     Follow Up Recommendations  Supervision/Assistance - 24 hour    Equipment Recommendations  3 in 1 bedside comode (delivered)    Recommendations for Other Services       Precautions / Restrictions Precautions Precautions: Fall Restrictions LLE Weight Bearing: Weight bearing as tolerated      Mobility Bed Mobility         Supine to sit: Min assist     General bed mobility comments: cues for sequence and assist for LLE  Transfers   Equipment used: Rolling walker (2 wheeled) Transfers: Sit to/from Stand           General transfer comment: for safety    Balance                                            ADL Overall ADL's : Needs assistance/impaired     Grooming: Set up;Sitting   Upper Body Bathing: Set up;Sitting   Lower Body Bathing: Sit to/from stand;Minimal assistance   Upper Body Dressing : Set up;Sitting   Lower Body Dressing: Moderate assistance;Sit to/from stand   Toilet Transfer: Min guard;Ambulation;BSC;RW   Toileting- Architect and Hygiene: Min guard;Sit to/from stand         General ADL Comments: ambulated to bathroom and sat on 3:1 and stood at toilet.  Educated on AE and pt practiced with sock aide.  Pt states that wife is anxious about helping him.  Family education would be helpful, but he is unsure about what time she will be here.  Educated on shower transfer, but pt is not yet ready for this     Vision     Perception     Praxis      Pertinent Vitals/Pain Pain Score: 6  Pain Location: L hip and thigh Pain Descriptors  / Indicators: Aching Pain Intervention(s): Limited activity within patient's tolerance;Monitored during session;Premedicated before session;Repositioned     Hand Dominance Right   Extremity/Trunk Assessment Upper Extremity Assessment Upper Extremity Assessment: Overall WFL for tasks assessed           Communication Communication Communication: No difficulties   Cognition Arousal/Alertness: Awake/alert Behavior During Therapy: WFL for tasks assessed/performed Overall Cognitive Status: Within Functional Limits for tasks assessed                     General Comments       Exercises       Shoulder Instructions      Home Living Family/patient expects to be discharged to:: Private residence Living Arrangements: Spouse/significant other Available Help at Discharge: Family Type of Home: House             Bathroom Shower/Tub: Walk-in shower (small)   Bathroom Toilet: Standard         Additional Comments: 3:1 and walker delivered to room      Prior Functioning/Environment Level of Independence: Independent        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[employer]  OT Diagnosis: Generalized weakness   OT Problem List: Decreased strength;Decreased activity tolerance;Decreased knowledge of use of DME or AE;Pain   OT Treatment/Interventions: Self-care/ADL training;DME and/or AE instruction;Patient/family education    OT Goals(Current goals can be found in the care plan section) Acute Rehab OT Goals Patient Stated Goal: Regain IND and return to work ASAP OT Goal Formulation: With patient Time For Goal Achievement: 02/08/16 Potential to Achieve Goals: Good ADL Goals Pt Will Transfer to Toilet: with supervision;ambulating;bedside commode Pt Will Perform Tub/Shower Transfer: Shower transfer;with min guard assist;ambulating;3 in 1 (vs verbalize sequence) Additional ADL Goal #1: Wife will verbalize comfort with assisting pt with bed mobility, bathroom  transfers and ADLs vs demonstrating assistance with supervision  OT Frequency: Min 2X/week   Barriers to D/C:            Co-evaluation              End of Session    Activity Tolerance: Patient tolerated treatment well Patient left: in chair;with call bell/phone within reach   Time: [id number] (Dr August Saucerean came in during session) OT Time Calculation (min): 41 min Charges:  OT General Charges $OT Visit: 1 Procedure OT Evaluation $OT Eval Low Complexity: 1 Procedure OT Treatments $Self Care/Home Management : 8-22 mins G-Codes:    Chad Wagner 02/01/2016, 9:43 AM Chad OtterMaryellen Carisha Wagner, OTR/L [phone] 02/01/2016

## 2016-02-01 NOTE — Progress Notes (Signed)
OT Note:  Wife present in pm.  I followed up with her for education & she verbalizes being comfortable assisting pt.  Pt not ready to step over shower ledge; reviewed sequence and pt/wife feel comfortable with this.  No further OT is needed at this time.  Will sign off.  LemayMaryellen Abdirahim Flavell, [place] Carolina[place] [phone] 02/01/2016

## 2016-02-01 NOTE — Progress Notes (Signed)
Pt and wife concerned about pt having a shower before he leaves the hospital. No order and only post-op day 2, called Dr. August Saucerean and received verbal OK for pt to shower today. Will continue to monitor

## 2016-02-02 LAB — CBC
HCT: 31.4 % — ABNORMAL LOW (ref 39.0–52.0)
Hemoglobin: 10.3 g/dL — ABNORMAL LOW (ref 13.0–17.0)
MCH: 29.9 pg (ref 26.0–34.0)
MCHC: 32.8 g/dL (ref 30.0–36.0)
MCV: 91 fL (ref 78.0–100.0)
Platelets: 129 10*3/uL — ABNORMAL LOW (ref 150–400)
RBC: 3.45 MIL/uL — ABNORMAL LOW (ref 4.22–5.81)
RDW: 13.8 % (ref 11.5–15.5)
WBC: 7.5 10*3/uL (ref 4.0–10.5)

## 2016-02-02 MED ORDER — METHOCARBAMOL 500 MG PO TABS: 500.0000 mg | ORAL_TABLET | Freq: Four times a day (QID) | ORAL | Status: DC | PRN

## 2016-02-02 MED ORDER — OXYCODONE HCL 5 MG PO TABS: 5.0000 mg | ORAL_TABLET | ORAL | Status: DC | PRN

## 2016-02-02 MED ORDER — ASPIRIN 325 MG PO TBEC: 325.0000 mg | DELAYED_RELEASE_TABLET | Freq: Two times a day (BID) | ORAL | Status: DC

## 2016-02-02 MED FILL — ZOLPIDEM TARTRATE 10 MG TAB: 10 | 10 days supply | Qty: 10 | Fill #0

## 2016-02-02 MED FILL — METHOCARBAMOL 500 MG TABLET: 500 | 8 days supply | Qty: 30 | Fill #0

## 2016-02-02 MED FILL — oxyCODONE HCL 5 MG TABS: 5 | 4 days supply | Qty: 60 | Fill #0

## 2016-02-02 NOTE — Progress Notes (Signed)
Subjective: Pt stable - progressing well in pt   Objective: Vital signs in last 24 hours: Temp:  [98.6 F (37 C)-99.3 F (37.4 C)] 98.8 F (37.1 C) (04/28 0457) Pulse Rate:  [79-92] 79 (04/28 0457) Resp:  [18-19] 18 (04/28 0457) BP: (106-120)/(64-74) 106/68 mmHg (04/28 0457) SpO2:  [97 %-99 %] 99 % (04/28 0457)  Intake/Output from previous day: 04/27 0701 - 04/28 0700 In: 240 [P.O.:240] Out: -  Intake/Output this shift: Total I/O In: 240 [P.O.:240] Out: -   Exam:  Neurovascular intact Intact pulses distally No cellulitis present  Labs:  Recent Labs  01/31/16 0336 02/01/16 0543  HGB 10.9* 11.3*    Recent Labs  01/31/16 0336 02/01/16 0543  WBC 7.3 7.7  RBC 3.69* 3.71*  HCT 33.3* 33.8*  PLT 129* 120*    Recent Labs  01/31/16 0336  NA 137  K 4.0  CL 102  CO2 26  BUN 12  CREATININE 0.88  GLUCOSE 160*  CALCIUM 8.2*   No results for input(s): LABPT, INR in the last 72 hours.  Assessment/Plan: Plan dc today - instructions reviewed   Yancy Knoble SCOTT 02/02/2016, 10:48 AM

## 2016-02-02 NOTE — Progress Notes (Signed)
   02/02/16 0914  PT Visit Information  Last PT Received On 02/02/16  Assistance Needed +1  Reason Eval/Treat Not Completed Patient declined, no reason specified (reports he wants dose of robaxin first, will returned once medicated.  )  History of Present Illness L THR  Joycelyn RuaAimee Rondel Episcopo, PTA pager [phone]

## 2016-02-02 NOTE — Progress Notes (Signed)
Physical Therapy Treatment Patient Details Name: Chad Wagner MRN: [id number] DOB: 05/31/1966 Today's Date: 02/02/2016    History of Present Illness L THR    PT Comments    Pt progressed gait training to step through gait sequencing.  Reviewed HEP, stair training, CP application and mobility for d/c.  Pt set for d/c home and RN informed.    Follow Up Recommendations  Home health PT     Equipment Recommendations  Rolling walker with 5" wheels    Recommendations for Other Services       Precautions / Restrictions Precautions Precautions: Fall Restrictions Weight Bearing Restrictions: Yes LLE Weight Bearing: Weight bearing as tolerated    Mobility  Bed Mobility Overal bed mobility: Needs Assistance Bed Mobility: Supine to Sit;Sit to Supine     Supine to sit: Supervision Sit to supine: Supervision   General bed mobility comments: Pt used non operative limb to negotiate operative limb in and out of bed.  Removed railing to simulate home enviroment, cues for hand placement to improve ease of movement.    Transfers Overall transfer level: Needs assistance Equipment used: Rolling walker (2 wheeled) Transfers: Sit to/from Stand Sit to Stand: Modified independent (Device/Increase time)         General transfer comment: Good technique.  Ambulation/Gait Ambulation/Gait assistance: Supervision Ambulation Distance (Feet): 400 Feet Assistive device: Rolling walker (2 wheeled) Gait Pattern/deviations: Step-through pattern;Antalgic;Decreased stride length;Decreased stance time - right;Decreased stance time - left Gait velocity: decr   General Gait Details: Cues for progression of LEs to simulate symmetrical step through gait pattern.  Pt required cues for weight shifting L and increasing step length on R.  Cues for forward gaze and scapular retraction.  Short gait trial 10 ft with cane,  Severly antalgic and would remain to benefit from RW at d/c to improve quality of gait  sequencing.  Raised RW height and educated pt and wife on RW height to improve posture during gait sequencing.     Stairs Stairs: Yes Stairs assistance: Supervision Stair Management: One rail Right;With cane;Forwards Number of Stairs: 10 General stair comments: Pt and wife educated on stair management. Pt performed forward negotiation with cane in left hand and railing on R side.  Pt required cues for sequencing of cane and hand placement on railing to improve ease.    Wheelchair Mobility    Modified Rankin (Stroke Patients Only)       Balance                                    Cognition Arousal/Alertness: Awake/alert Behavior During Therapy: WFL for tasks assessed/performed Overall Cognitive Status: Within Functional Limits for tasks assessed                      Exercises Total Joint Exercises Ankle Circles/Pumps: AROM;15 reps;Supine;Both Quad Sets: AROM;10 reps;Both;Supine Short Arc Quad: AROM;Left;10 reps;Supine Heel Slides: Left;AAROM;10 reps;Supine Hip ABduction/ADduction: AROM;Left;10 reps;15 reps;Supine;Standing;AAROM (1x15 standing AROM and 1x10 in supine with AAROM) Straight Leg Raises: AAROM;Left;10 reps;Supine Long Arc Quad: AROM;Left;10 reps;Seated Knee Flexion: AROM;Standing;Left;15 reps Marching in Standing: AROM;Left;15 reps;Standing Standing Hip Extension: AROM;Standing;Left;15 reps Other Exercises Other Exercises: Issued HEP and educated on frequency and technique.  PTA educated on B hamstring and quad sets with strap.  Pt also educated on how to self assist with strap.  Wife present and able to tech back method to therapist.  General Comments        Pertinent Vitals/Pain Pain Assessment: 0-10 Pain Score: 6  Pain Location: L quad and R glut Pain Descriptors / Indicators: Tightness;Heaviness Pain Intervention(s): Monitored during session;Repositioned    Home Living                      Prior Function             PT Goals (current goals can now be found in the care plan section) Acute Rehab PT Goals Patient Stated Goal: Regain IND and return to work ASAP Potential to Achieve Goals: Good Progress towards PT goals: Progressing toward goals    Frequency  7X/week    PT Plan Current plan remains appropriate    Co-evaluation             End of Session Equipment Utilized During Treatment: Gait belt Activity Tolerance: Patient tolerated treatment well Patient left: in chair;with call bell/phone within reach;with family/visitor present     Time: 1132-1224 PT Time Calculation (min) (ACUTE ONLY): 52 min  Charges:  $Gait Training: 23-37 mins $Therapeutic Exercise: 8-22 mins                    G Codes:      Florestine Avers Feb 06, 2016, 12:34 PM  Joycelyn Rua, PTA pager [phone]

## 2016-02-02 NOTE — Progress Notes (Signed)
Chad Wagner to be D/C'd Home per MD order. Discussed with the patient and all questions fully answered.    Medication List    TAKE these medications        acetaminophen 500 MG tablet  Commonly known as:  TYLENOL  Take 1,000 mg by mouth 2 (two) times daily as needed for mild pain.     aspirin 325 MG EC tablet  Take 1 tablet (325 mg total) by mouth 2 (two) times daily.     buPROPion 100 MG 12 hr tablet  Commonly known as:  WELLBUTRIN SR  Take 100 mg by mouth every morning.     cetirizine 10 MG tablet  Commonly known as:  ZYRTEC  Take 10 mg by mouth daily.     citalopram 40 MG tablet  Commonly known as:  CELEXA  Take 40 mg by mouth every evening.     methocarbamol 500 MG tablet  Commonly known as:  ROBAXIN  Take 1 tablet (500 mg total) by mouth every 6 (six) hours as needed for muscle spasms.     oxyCODONE 5 MG immediate release tablet  Commonly known as:  Oxy IR/ROXICODONE  Take 1-2 tablets (5-10 mg total) by mouth every 3 (three) hours as needed for breakthrough pain.     VITAMIN D3 SUPER STRENGTH 2000 units Tabs  Generic drug:  Cholecalciferol  Take 2,000 Units by mouth daily.        VVS, Skin clean, dry and intact without evidence of skin break down, no evidence of skin tears noted.  IV catheter discontinued intact. Site without signs and symptoms of complications. Dressing and pressure applied.  An After Visit Summary was printed and given to the patient.  Patient escorted via WC, and D/C home via private auto.  Kai LevinsJacobs, Ronon Ferger N  02/02/2016 2:38 PM

## 2016-02-06 MED FILL — oxyCODONE HCL 5 MG TABS: 5 | 10 days supply | Qty: 60 | Fill #0

## 2016-02-06 NOTE — Discharge Summary (Signed)
Physician Discharge Summary  Patient ID: Chad Wagner MRN: [id number] DOB/AGE: 04/11/1966 50 y.o.  Admit date: 01/30/2016 Discharge date: 02/02/2016  Admission Diagnoses:  Active Problems:   Hip arthritis   Discharge Diagnoses:  Same  Surgeries: Procedure(s): LEFT TOTAL HIP ARTHROPLASTY ANTERIOR APPROACH on 01/30/2016   Consultants:    Discharged Condition: Stable  Hospital Course: Chad Wagner is an 50 y.o. male who was admitted 01/30/2016 with a chief complaint of left hip pain, and found to have a diagnosis of left hip arthritis.  They were brought to the operating room on 01/30/2016 and underwent the above named procedures.  He tolerated procedure well and mobilize with physical therapy on postoperative days 1 and 2.  He was stable for discharge on postoperative day #3 on aspirin for DVT prophylaxis.  Was mobilizing and walking with a walker.  Pain was controlled on pain medicine.  He will follow up with me 10 days from discharge  Antibiotics given:  Anti-infectives    Start     Dose/Rate Route Frequency Ordered Stop   01/30/16 2200  vancomycin (VANCOCIN) IVPB 1000 mg/200 mL premix     1,000 mg 200 mL/hr over 60 Minutes Intravenous Every 12 hours 01/30/16 2021 01/30/16 2300   01/30/16 1330  clindamycin (CLEOCIN) IVPB 900 mg     900 mg 100 mL/hr over 30 Minutes Intravenous To ShortStay [facility] 01/29/16 1413 01/30/16 1542    .  Recent vital signs:  Filed Vitals:   02/02/16 0457 02/02/16 1300  BP: 106/68 114/83  Pulse: 79 71  Temp: 98.8 F (37.1 C) 98.3 F (36.8 C)  Resp: 18 17    Recent laboratory studies:  Results for orders placed or performed during the hospital encounter of 01/30/16  CBC  Result Value Ref Range   WBC 7.3 4.0 - 10.5 K/uL   RBC 3.69 (L) 4.22 - 5.81 MIL/uL   Hemoglobin 10.9 (L) 13.0 - 17.0 g/dL   HCT 40.9 (L) 81.1 - 91.4 %   MCV 90.2 78.0 - 100.0 fL   MCH 29.5 26.0 - 34.0 pg   MCHC 32.7 30.0 - 36.0 g/dL   RDW 78.2 95.6 - 21.3 %    Platelets 129 (L) 150 - 400 K/uL  Basic metabolic panel  Result Value Ref Range   Sodium 137 135 - 145 mmol/L   Potassium 4.0 3.5 - 5.1 mmol/L   Chloride 102 101 - 111 mmol/L   CO2 26 22 - 32 mmol/L   Glucose, Bld 160 (H) 65 - 99 mg/dL   BUN 12 6 - 20 mg/dL   Creatinine, Ser 0.86 0.61 - 1.24 mg/dL   Calcium 8.2 (L) 8.9 - 10.3 mg/dL   GFR calc non Af Amer >60 >60 mL/min   GFR calc Af Amer >60 >60 mL/min   Anion gap 9 5 - 15  CBC  Result Value Ref Range   WBC 7.7 4.0 - 10.5 K/uL   RBC 3.71 (L) 4.22 - 5.81 MIL/uL   Hemoglobin 11.3 (L) 13.0 - 17.0 g/dL   HCT 57.8 (L) 46.9 - 62.9 %   MCV 91.1 78.0 - 100.0 fL   MCH 30.5 26.0 - 34.0 pg   MCHC 33.4 30.0 - 36.0 g/dL   RDW 52.8 41.3 - 24.4 %   Platelets 120 (L) 150 - 400 K/uL  CBC  Result Value Ref Range   WBC 7.5 4.0 - 10.5 K/uL   RBC 3.45 (L) 4.22 - 5.81 MIL/uL   Hemoglobin 10.3 (L)  13.0 - 17.0 g/dL   HCT 09.8 (L) 11.9 - 14.7 %   MCV 91.0 78.0 - 100.0 fL   MCH 29.9 26.0 - 34.0 pg   MCHC 32.8 30.0 - 36.0 g/dL   RDW 82.9 56.2 - 13.0 %   Platelets 129 (L) 150 - 400 K/uL    Discharge Medications:     Medication List    TAKE these medications        acetaminophen 500 MG tablet  Commonly known as:  TYLENOL  Take 1,000 mg by mouth 2 (two) times daily as needed for mild pain.     aspirin 325 MG EC tablet  Take 1 tablet (325 mg total) by mouth 2 (two) times daily.     buPROPion 100 MG 12 hr tablet  Commonly known as:  WELLBUTRIN SR  Take 100 mg by mouth every morning.     cetirizine 10 MG tablet  Commonly known as:  ZYRTEC  Take 10 mg by mouth daily.     citalopram 40 MG tablet  Commonly known as:  CELEXA  Take 40 mg by mouth every evening.     methocarbamol 500 MG tablet  Commonly known as:  ROBAXIN  Take 1 tablet (500 mg total) by mouth every 6 (six) hours as needed for muscle spasms.     oxyCODONE 5 MG immediate release tablet  Commonly known as:  Oxy IR/ROXICODONE  Take 1-2 tablets (5-10 mg total) by mouth  every 3 (three) hours as needed for breakthrough pain.     VITAMIN D3 SUPER STRENGTH 2000 units Tabs  Generic drug:  Cholecalciferol  Take 2,000 Units by mouth daily.        Diagnostic Studies: Dg Hip Port Unilat With Pelvis 1v Left  01/30/2016  CLINICAL DATA:  Patient status post left hip arthroplasty. EXAM: DG HIP (WITH OR WITHOUT PELVIS) 1V PORT LEFT COMPARISON:  Fluoroscopic images earlier same date. FINDINGS: Patient status post left hip arthroplasty. Hardware appears in appropriate position. No evidence for acute osseous abnormality. Right hip joint degenerative changes. IMPRESSION: Patient status post left hip arthroplasty. Electronically Signed   By: Annia Belt M.D.   On: 01/30/2016 19:59   Dg Hip Operative Unilat W Or W/o Pelvis Left  01/30/2016  CLINICAL DATA:  Status post left hip replacement today. Intraoperative imaging. Initial encounter. EXAM: OPERATIVE LEFT HIP (WITH PELVIS IF PERFORMED) 2 VIEWS TECHNIQUE: Fluoroscopic spot image(s) were submitted for interpretation post-operatively. COMPARISON:  None. FINDINGS: Two fluoroscopic spot views of the low pelvis left hip demonstrate a left hip arthroplasty in place. The device is located. No fracture is identified. IMPRESSION: Left hip replacement.  No acute abnormality. Electronically Signed   By: Drusilla Kanner M.D.   On: 01/30/2016 18:48    Disposition: 01-Home or Self Care      Discharge Instructions    Call MD / Call 911    Complete by:  As directed   If you experience chest pain or shortness of breath, CALL 911 and be transported to the hospital emergency room.  If you develope a fever above 101 F, pus (white drainage) or increased drainage or redness at the wound, or calf pain, call your surgeon's office.     Constipation Prevention    Complete by:  As directed   Drink plenty of fluids.  Prune juice may be helpful.  You may use a stool softener, such as Colace (over the counter) 100 mg twice a day.  Use MiraLax (over the  counter) for constipation as needed.     Diet - low sodium heart healthy    Complete by:  As directed      Discharge instructions    Complete by:  As directed   Weight bearing as tolerated Ok to shower Take aspirin twice daily - 1 twice a day .INSTRUCTIONS AFTER JOINT REPLACEMENT   Remove items at home which could result in a fall. This includes throw rugs or furniture in walking pathways ICE to the affected joint every three hours while awake for 30 minutes at a time, for at least the first 3-5 days, and then as needed for pain and swelling.  Continue to use ice for pain and swelling. You may notice swelling that will progress down to the foot and ankle.  This is normal after surgery.  Elevate your leg when you are not up walking on it.   Continue to use the breathing machine you got in the hospital (incentive spirometer) which will help keep your temperature down.  It is common for your temperature to cycle up and down following surgery, especially at night when you are not up moving around and exerting yourself.  The breathing machine keeps your lungs expanded and your temperature down.   DIET:  As you were doing prior to hospitalization, we recommend a well-balanced diet.  DRESSING / WOUND CARE / SHOWERING  Keep the surgical dressing until follow up.  The dressing is water proof, so you can shower without any extra covering.  IF THE DRESSING FALLS OFF or the wound gets wet inside, change the dressing with sterile gauze.  Please use good hand washing techniques before changing the dressing.  Do not use any lotions or creams on the incision until instructed by your surgeon.    ACTIVITY  Increase activity slowly as tolerated, but follow the weight bearing instructions below.   No driving for 6 weeks or until further direction given by your physician.  You cannot drive while taking narcotics.  No lifting or carrying greater than 10 lbs. until further directed by your surgeon. Avoid periods  of inactivity such as sitting longer than an hour when not asleep. This helps prevent blood clots.  You may return to work once you are authorized by your doctor.     WEIGHT BEARING   Weight bearing as tolerated with assist device (walker, cane, etc) as directed, use it as long as suggested by your surgeon or therapist, typically at least 4-6 weeks.   EXERCISES  Results after joint replacement surgery are often greatly improved when you follow the exercise, range of motion and muscle strengthening exercises prescribed by your doctor. Safety measures are also important to protect the joint from further injury. Any time any of these exercises cause you to have increased pain or swelling, decrease what you are doing until you are comfortable again and then slowly increase them. If you have problems or questions, call your caregiver or physical therapist for advice.   Rehabilitation is important following a joint replacement. After just a few days of immobilization, the muscles of the leg can become weakened and shrink (atrophy).  These exercises are designed to build up the tone and strength of the thigh and leg muscles and to improve motion. Often times heat used for twenty to thirty minutes before working out will loosen up your tissues and help with improving the range of motion but do not use heat for the first two weeks following surgery (sometimes heat can increase  post-operative swelling).   These exercises can be done on a training (exercise) mat, on the floor, on a table or on a bed. Use whatever works the best and is most comfortable for you.    Use music or television while you are exercising so that the exercises are a pleasant break in your day. This will make your life better with the exercises acting as a break in your routine that you can look forward to.   Perform all exercises about fifteen times, three times per day or as directed.  You should exercise both the operative leg and the  other leg as well.   Exercises include:   Quad Sets - Tighten up the muscle on the front of the thigh (Quad) and hold for 5-10 seconds.   Straight Leg Raises - With your knee straight (if you were given a brace, keep it on), lift the leg to 60 degrees, hold for 3 seconds, and slowly lower the leg.  Perform this exercise against resistance later as your leg gets stronger.  Leg Slides: Lying on your back, slowly slide your foot toward your buttocks, bending your knee up off the floor (only go as far as is comfortable). Then slowly slide your foot back down until your leg is flat on the floor again.  Angel Wings: Lying on your back spread your legs to the side as far apart as you can without causing discomfort.  Hamstring Strength:  Lying on your back, push your heel against the floor with your leg straight by tightening up the muscles of your buttocks.  Repeat, but this time bend your knee to a comfortable angle, and push your heel against the floor.  You may put a pillow under the heel to make it more comfortable if necessary.   A rehabilitation program following joint replacement surgery can speed recovery and prevent re-injury in the future due to weakened muscles. Contact your doctor or a physical therapist for more information on knee rehabilitation.    CONSTIPATION  Constipation is defined medically as fewer than three stools per week and severe constipation as less than one stool per week.  Even if you have a regular bowel pattern at home, your normal regimen is likely to be disrupted due to multiple reasons following surgery.  Combination of anesthesia, postoperative narcotics, change in appetite and fluid intake all can affect your bowels.   YOU MUST use at least one of the following options; they are listed in order of increasing strength to get the job done.  They are all available over the counter, and you may need to use some, POSSIBLY even all of these options:    Drink plenty of  fluids (prune juice may be helpful) and high fiber foods Colace 100 mg by mouth twice a day  Senokot for constipation as directed and as needed Dulcolax (bisacodyl), take with full glass of water  Miralax (polyethylene glycol) once or twice a day as needed.  If you have tried all these things and are unable to have a bowel movement in the first 3-4 days after surgery call either your surgeon or your primary doctor.    If you experience loose stools or diarrhea, hold the medications until you stool forms back up.  If your symptoms do not get better within 1 week or if they get worse, check with your doctor.  If you experience "the worst abdominal pain ever" or develop nausea or vomiting, please contact the office immediately for further  recommendations for treatment.   ITCHING:  If you experience itching with your medications, try taking only a single pain pill, or even half a pain pill at a time.  You can also use Benadryl over the counter for itching or also to help with sleep.   TED HOSE STOCKINGS:  Use stockings on both legs until for at least 2 weeks or as directed by physician office. They may be removed at night for sleeping.  MEDICATIONS:  See your medication summary on the "After Visit Summary" that nursing will review with you.  You may have some home medications which will be placed on hold until you complete the course of blood thinner medication.  It is important for you to complete the blood thinner medication as prescribed.  PRECAUTIONS:  If you experience chest pain or shortness of breath - call 911 immediately for transfer to the hospital emergency department.   If you develop a fever greater that 101 F, purulent drainage from wound, increased redness or drainage from wound, foul odor from the wound/dressing, or calf pain - CONTACT YOUR SURGEON.                                                   FOLLOW-UP APPOINTMENTS:  If you do not already have a post-op appointment, please call  the office for an appointment to be seen by your surgeon.  Guidelines for how soon to be seen are listed in your "After Visit Summary", but are typically between 1-4 weeks after surgery.  OTHER INSTRUCTIONS:   Knee Replacement:  Do not place pillow under knee, focus on keeping the knee straight while resting. CPM instructions: 0-90 degrees, 2 hours in the morning, 2 hours in the afternoon, and 2 hours in the evening. Place foam block, curve side up under heel at all times except when in CPM or when walking.  DO NOT modify, tear, cut, or change the foam block in any way.  MAKE SURE YOU:  Understand these instructions.  Get help right away if you are not doing well or get worse.    Thank you for letting us be a part of your medical care team.  It is a privilege we respect greatly.  We hope these instructions will help you stay on track for a fast and full recovery!     Increase activity slowly as tolerated    Complete by:  As directed            Follow-up Information    Follow up with [facility].   Why:  They will contact you to schedule home therapy visits.    Contact information:   [address] [place] [address] [phone]        Signed: Cammy Copa 02/06/2016, 10:47 PM

## 2016-02-12 MED FILL — ZOLPIDEM TARTRATE 10 MG TAB: 10 | 30 days supply | Qty: 30 | Fill #0

## 2016-09-13 ENCOUNTER — Telehealth (INDEPENDENT_AMBULATORY_CARE_PROVIDER_SITE_OTHER): Payer: Self-pay | Admitting: Orthopedic Surgery

## 2016-09-13 NOTE — Telephone Encounter (Signed)
Pt called in reference of getting an antibiotic, he stated that his dentist told him to call us about this. I see he had surgery several months back. I could hardly hear him on the phone and was unable to get a lot of information. Can you please advise. [phone]

## 2016-09-17 NOTE — Telephone Encounter (Signed)
Yes pls call thx

## 2016-09-17 NOTE — Telephone Encounter (Signed)
Yes pls call thx amox 2 g 1 hour before cleaning

## 2016-09-17 NOTE — Telephone Encounter (Signed)
Pt stated [facility] is where he wants this

## 2016-09-17 NOTE — Telephone Encounter (Signed)
Left voicemail asking patient where he wants antibiotic to be sent and we can do this for him.

## 2016-09-18 MED ORDER — DOXYCYCLINE HYCLATE 50 MG PO CAPS: ORAL_CAPSULE | ORAL | 0 refills | Status: DC

## 2016-09-18 MED FILL — DOXYCYCLINE HYC 50 MG CAP: 50 | 1 days supply | Qty: 2 | Fill #0

## 2016-09-18 NOTE — Telephone Encounter (Signed)
I called spoke with patient advised antibiotic had to be changed due to his allergy to penicillin. Advised him he would take doxycycline 50mg  2x's 1 hour before procedure.

## 2017-02-11 ENCOUNTER — Ambulatory Visit: Payer: Self-pay | Admitting: Physician Assistant

## 2017-02-11 VITALS — BP 140/80 | HR 67 | Temp 98.3°F

## 2017-02-11 DIAGNOSIS — S76311A Strain of muscle, fascia and tendon of the posterior muscle group at thigh level, right thigh, initial encounter: Secondary | ICD-10-CM

## 2017-02-11 NOTE — Progress Notes (Signed)
S: c/o large bruise on back of r leg, states he was playing softball last week and injured it while rounding 3rd base, area has been painful but today noticed a very large bruise on the back of his leg, no loss of motion, is painful to sit, painful to bend forward, no numbness or tingling, hx of bad hips, is followed at [facility]  O: vitals wnl, nad, skin with large approx 10x6 bruise on posterior leg in hamstring area, tender at area below gluteus maximus, tender at area behind the knee just below bruising, pt able to walk without limp, n/v intact  A: hamstring tear  P: ace wrap x 2 applied to thigh, ice , nsaids, rest, [facility], return to clinic if worsening

## 2017-02-11 NOTE — Patient Instructions (Addendum)
Hamstring Strain Rehab Ask your health care provider which exercises are safe for you. Do exercises exactly as told by your health care provider and adjust them as directed. It is normal to feel mild stretching, pulling, tightness, or discomfort as you do these exercises, but you should stop right away if you feel sudden pain or your pain gets worse.Do not begin these exercises until told by your health care provider. Strengthening exercises These exercises build strength and endurance in your thighs. Endurance is the ability to use your muscles for a long time, even after your muscles get tired. Exercise A: Straight leg raises (hip extensors) 1. Lie on your belly on a firm surface. 2. Tense the muscles in your buttocks to lift your left / right leg about 4 inches (10 cm). Keep your knee straight. 3. If you cannot lift your leg this high without arching your back, place a pillow under your hips. 4. Hold the position for __________ seconds. 5. Slowly lower your leg to the starting position and allow it to relax completely before you start the next repetition. Repeat __________ times. Complete this exercise __________ times a day. Exercise B: Bridge (hip extensors) 1. Lie on your back on a firm surface with your knees bent and your feet flat on the floor. 2. Tighten your buttocks muscles and lift your bottom off the floor until your trunk is level with your thighs.  You should feel the muscles working in your buttocks and the back of your thighs.  Do not arch your back. 3. Hold this position for __________ seconds. 4. Slowly lower your hips to the starting position. 5. Let your buttocks muscles relax completely between repetitions. Repeat __________ times. Complete this exercise __________ times a day. If told by your health care provider, keep your bottom lifted off the floor while you slowly walk your feet away from you as far as you can control. Hold for __________ seconds, then slowly walk  your feet back toward you. Exercise C: Lateral walking with band (hip abductors) 1. Stand in a long hallway. 2. Wrap a loop of exercise band around your legs, just above your knees. 3. Bend your knees gently and drop your hips down and back so your weight is over your heels. 4. Step to the side to move down the length of the hallway, keeping your toes pointed ahead of you and keeping tension in the band. 5. Repeat, leading with your other leg. Repeat __________ times. Complete this exercise __________ times a day. Exercise D: Single leg stand with reaching (eccentric hamstring) 1. Stand on your left / right foot. Keep your big toe down on the floor and try to keep your arch lifted. 2. Slowly reach down toward the floor as far as you can while keeping your balance. 3. Hold this position for __________ seconds. Repeat __________ times. Complete this exercise __________ times a day. Exercise E: Prone plank (abdominals and core) 1. Lie on your belly on the floor and prop yourself up on your elbows. Your hands should be straight out in front of you, and your elbows should be below your shoulders. Position your feet so the balls of your feet touch the ground. The ball of your foot is on the walking surface, right under your toes. 2. Tighten your abdominal muscles and lift your body off the floor.  Do not arch your back.  Do not hold your breath. 3. Hold this position for __________ seconds. Repeat __________ times. Complete this exercise __________   times a day. This information is not intended to replace advice given to you by your health care provider. Make sure you discuss any questions you have with your health care provider. Document Released: 09/23/2005 Document Revised: 05/30/2016 Document Reviewed: 06/22/2015 Elsevier Interactive Patient Education  2017 [employer] Inc[employer]  

## 2017-06-08 IMAGING — CR DG HIP (WITH OR WITHOUT PELVIS) 1V PORT*L*
1 series · 1 of 1 positions shown · non-contrast
Comparison: Fluoroscopic images earlier same date.

CLINICAL DATA: Patient status post left hip arthroplasty.

EXAM:
DG HIP (WITH OR WITHOUT PELVIS) 1V PORT LEFT

[AP]
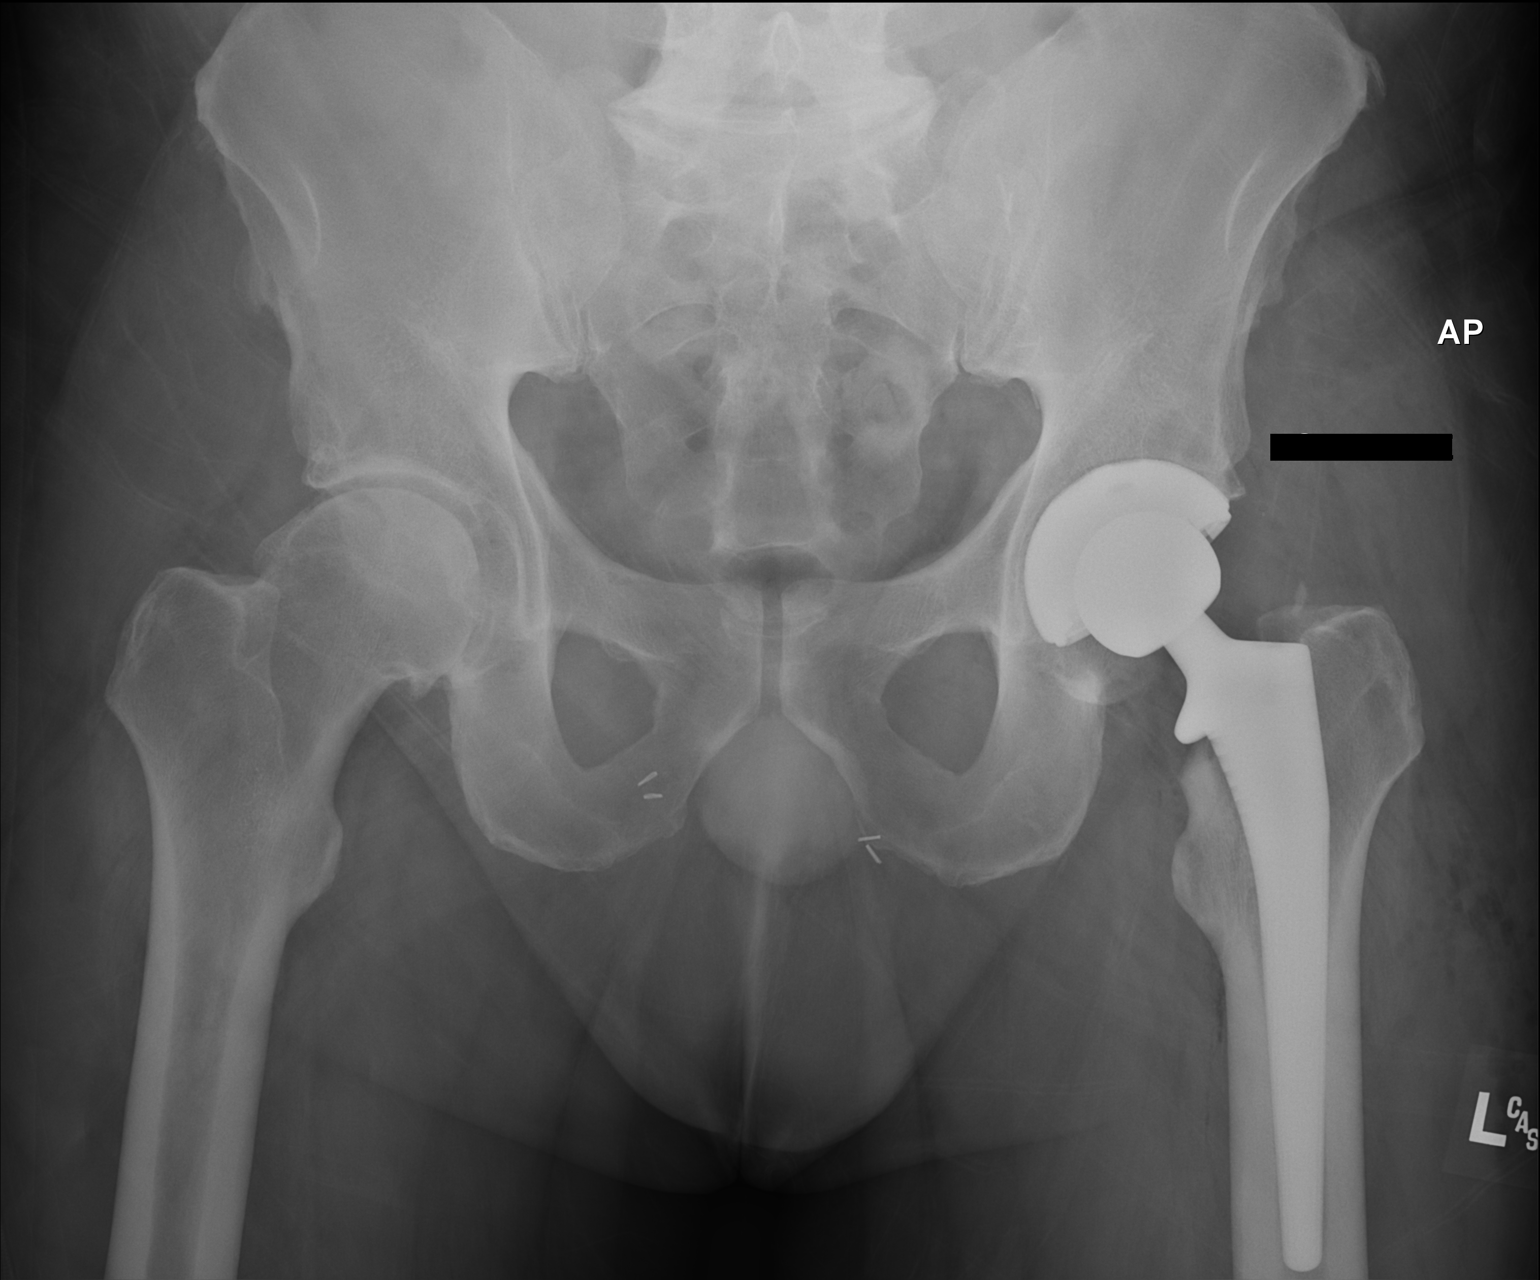

[1 of 1 positions shown; findings below may reference images not displayed]

FINDINGS: Patient status post left hip arthroplasty. Hardware appears in
appropriate position. No evidence for acute osseous abnormality.
Right hip joint degenerative changes.
IMPRESSION: Patient status post left hip arthroplasty.

## 2017-10-01 ENCOUNTER — Ambulatory Visit (INDEPENDENT_AMBULATORY_CARE_PROVIDER_SITE_OTHER): Payer: Non-veteran care

## 2017-10-01 ENCOUNTER — Encounter (INDEPENDENT_AMBULATORY_CARE_PROVIDER_SITE_OTHER): Payer: Self-pay | Admitting: Orthopedic Surgery

## 2017-10-01 ENCOUNTER — Ambulatory Visit (INDEPENDENT_AMBULATORY_CARE_PROVIDER_SITE_OTHER): Payer: Non-veteran care | Admitting: Orthopedic Surgery

## 2017-10-01 DIAGNOSIS — M25551 Pain in right hip: Secondary | ICD-10-CM

## 2017-10-01 DIAGNOSIS — M1611 Unilateral primary osteoarthritis, right hip: Secondary | ICD-10-CM

## 2017-10-02 ENCOUNTER — Encounter (INDEPENDENT_AMBULATORY_CARE_PROVIDER_SITE_OTHER): Payer: Self-pay | Admitting: Orthopedic Surgery

## 2017-10-02 NOTE — Progress Notes (Signed)
Office Visit Note   Patient: Chad Wagner           Date of Birth: 08/29/1966           MRN: [id number] Visit Date: 10/01/2017 Requested by: Artis DelayBaltzell, Jonathan R, PA-C [id number] [place], [place] [id number] PCP: System, Pcp Not In  Subjective: Chief Complaint  Patient presents with  . Right Hip - Pain    HPI: Chad Wagner is a patient who underwent left hip replacement last year.  He has done well with that.  Describes incapacitating pain in the right hip.  He has had an MRI scan which is reviewed.  That scan shows moderate arthritis in the right hip.  Reports limping as well as groin pain.  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  He has his wife and son with him at home.  He lives in [place].              ROS: All systems reviewed are negative as they relate to the chief complaint within the history of present illness.  Patient denies  fevers or chills.   Assessment & Plan: Visit Diagnoses:  1. Unilateral primary osteoarthritis, right hip   2. Pain in right hip     Plan: Impression is right hip arthritis in a young patient.  He is done well with his left total hip replacement.  Good health otherwise.  Plan is right total hip replacement.  Risk and benefits are discussed including but not limited to infection nerve or vessel damage leg length inequality and dislocation among others.  She understands the risk and benefits as well as the rehabilitative process.  Anticipate surgery in March.  All questions answered.  Follow-Up Instructions: No Follow-up on file.   Orders:  Orders Placed This Encounter  Procedures  . XR HIP UNILAT W OR W/O PELVIS 2-3 VIEWS RIGHT   No orders of the defined types were placed in this encounter.     Procedures: No procedures performed   Clinical Data: No additional findings.  Objective: Vital Signs: There were no vitals taken for this visit.  Physical Exam:   Constitutional: Patient appears well-developed HEENT:  Head:  Normocephalic Eyes:EOM are normal Neck: Normal range of motion Cardiovascular: Normal rate Pulmonary/chest: Effort normal Neurologic: Patient is alert Skin: Skin is warm Psychiatric: Patient has normal mood and affect    Ortho Exam: Pubic exam demonstrates normal leg lengths with good hip abduction flexion strength bilaterally.  Ankle dorsiflexion plantarflexion is intact bilaterally.  He does have limited range of motion with internal rotation on the right-hand side compared to the left.  He has pain with abduction on the right compared to the left.  Specialty Comments:  No specialty comments available.  Imaging: Xr Hip Unilat W Or W/o Pelvis 2-3 Views Right  Result Date: 10/02/2017 AP pelvis lateral right hip shows well seated left total hip replacement.  Right hip arthritis is present without bone-on-bone changes.  There is spurring off of the femoral head on all views on the right-hand side.  Remainder bony pelvis normal.  Left hip replacement in good position and alignment.    PMFS History: Patient Active Problem List   Diagnosis Date Noted  . Hip arthritis 01/30/2016   Past Medical History:  Diagnosis Date  . Anger   . Asthma    MILD  DUE TO ALLERGIES   (RESOLVED)  . Depression   . Varicose vein of leg     History  reviewed. No pertinent family history.  Past Surgical History:  Procedure Laterality Date  . APPENDECTOMY     1985  . SHOULDER ARTHROSCOPY WITH OPEN ROTATOR CUFF REPAIR AND DISTAL CLAVICLE ACROMINECTOMY Right 11/02/2015   Procedure: SHOULDER DIAGNOSTIC OPERATIVE ARTHROSCOPY WITH MINI-OPEN ROTATOR CUFF REPAIR, DISTAL CLAVICLE EXCISION,  BICEPS TENODESIS.  ;  Surgeon: Cammy CopaScott Tristine Langi, MD;  Location: [facility];  Service: Orthopedics;  Laterality: Right;  . TONSILLECTOMY     T+A 1980  . TOTAL HIP ARTHROPLASTY Left 01/30/2016   Procedure: LEFT TOTAL HIP ARTHROPLASTY ANTERIOR APPROACH;  Surgeon: Cammy CopaScott Zachrey Deutscher, MD;  Location: [facility] OR;  Service: Orthopedics;   Laterality: Left;  Marland Kitchen. VARICOSE VEIN SURGERY     1993  . VASECTOMY     2010   Social History   Occupational History  . Not on file  Tobacco Use  . Smoking status: Former Games developermoker  . Smokeless tobacco: Never Used  Substance and Sexual Activity  . Alcohol use: Yes    Alcohol/week: 0.0 oz    Types: 4 - 5 Glasses of wine per week  . Drug use: No  . Sexual activity: Not on file

## 2017-11-19 ENCOUNTER — Other Ambulatory Visit (INDEPENDENT_AMBULATORY_CARE_PROVIDER_SITE_OTHER): Payer: Self-pay | Admitting: Orthopedic Surgery

## 2017-11-19 DIAGNOSIS — M1611 Unilateral primary osteoarthritis, right hip: Secondary | ICD-10-CM

## 2017-11-26 NOTE — Pre-Procedure Instructions (Addendum)
Erline LevineMichael I Rozman  11/26/2017      [facility] [address], [place] - [place], [place] [address] [place] [id number] Phone: [phone] Fax: [phone]    Your procedure is scheduled on December 09, 2017.  [facility] at 530 AM.  Call this number if you have problems the morning of surgery:  [phone]   Remember:  Do not eat food or drink liquids after midnight.  Take these medicines the morning of surgery with A SIP OF WATER acetaminophen (tylenol), bupropion (wellbutrin), cetirizine (zyrtec).  7 days prior to surgery STOP taking any Aspirin (unless otherwise instructed by your surgeon), Aleve, Naproxen, Ibuprofen, Motrin, Advil, Goody's, BC's, all herbal medications, fish oil, and all vitamins  Continue all other medications as instructed by your physician except follow the above medication instructions before surgery   Do not wear jewelry.  Do not wear lotions, powders, or colognes, or deodorant.  Men may shave face and neck.  Do not bring valuables to the hospital.  [facility] is not responsible for any belongings or valuables.  Contacts, dentures or bridgework may not be worn into surgery.  Leave your suitcase in the car.  After surgery it may be brought to your room.  For patients admitted to the hospital, discharge time will be determined by your treatment team.  Patients discharged the day of surgery will not be allowed to drive home.   Special instructions:  [facility]- Preparing For Surgery  Before surgery, you can play an important role. Because skin is not sterile, your skin needs to be as free of germs as possible. You can reduce the number of germs on your skin by washing with CHG (chlorahexidine gluconate) Soap before surgery.  CHG is an antiseptic cleaner which kills germs and bonds with the skin to continue killing germs even after washing.  Please do not use if you have an allergy  to CHG or antibacterial soaps. If your skin becomes reddened/irritated stop using the CHG.  Do not shave (including legs and underarms) for at least 48 hours prior to first CHG shower. It is OK to shave your face.  Please follow these instructions carefully.   1. Shower the NIGHT BEFORE SURGERY and the MORNING OF SURGERY with CHG.   2. If you chose to wash your hair, wash your hair first as usual with your normal shampoo.  3. After you shampoo, rinse your hair and body thoroughly to remove the shampoo.  4. Use CHG as you would any other liquid soap. You can apply CHG directly to the skin and wash gently with a scrungie or a clean washcloth.   5. Apply the CHG Soap to your body ONLY FROM THE NECK DOWN.  Do not use on open wounds or open sores. Avoid contact with your eyes, ears, mouth and genitals (private parts). Wash Face and genitals (private parts)  with your normal soap.  6. Wash thoroughly, paying special attention to the area where your surgery will be performed.  7. Thoroughly rinse your body with warm water from the neck down.  8. DO NOT shower/wash with your normal soap after using and rinsing off the CHG Soap.  9. Pat yourself dry with a CLEAN TOWEL.  10. Wear CLEAN PAJAMAS to bed the night before surgery, wear comfortable clothes the morning of surgery  11. Place CLEAN SHEETS on your bed the night of your first  shower and DO NOT SLEEP WITH PETS.  Day of Surgery: Do not apply any deodorants/lotions. Please wear clean clothes to the hospital/surgery center.    Please read over the following fact sheets that you were given. Pain Booklet, Coughing and Deep Breathing, MRSA Information and Surgical Site Infection Prevention

## 2017-11-26 NOTE — Progress Notes (Addendum)
PCP: Floyde Parkinshristopher Dyer, MD  Cardiologist: pt denies  EKG: pt denies past year, obtained today  Stress test: pt denies  ECHO: pt denies  Cardiac Cath: pt denies   Chest x-ray: pt denies past year, obtained today

## 2017-11-27 ENCOUNTER — Encounter (HOSPITAL_COMMUNITY): Payer: Self-pay

## 2017-11-27 ENCOUNTER — Ambulatory Visit (HOSPITAL_COMMUNITY)
Admission: RE | Admit: 2017-11-27 | Discharge: 2017-11-27 | Disposition: A | Discharge status: Home / Self Care | Payer: Non-veteran care | Source: Ambulatory Visit | Attending: Orthopedic Surgery | Admitting: Orthopedic Surgery

## 2017-11-27 ENCOUNTER — Other Ambulatory Visit: Payer: Self-pay

## 2017-11-27 ENCOUNTER — Encounter (HOSPITAL_COMMUNITY)
Admission: RE | Admit: 2017-11-27 | Discharge: 2017-11-27 | Disposition: A | Discharge status: Home / Self Care | Payer: Non-veteran care | Source: Ambulatory Visit | Attending: Orthopedic Surgery | Admitting: Orthopedic Surgery

## 2017-11-27 DIAGNOSIS — M1611 Unilateral primary osteoarthritis, right hip: Secondary | ICD-10-CM

## 2017-11-27 DIAGNOSIS — M169 Osteoarthritis of hip, unspecified: Secondary | ICD-10-CM

## 2017-11-27 DIAGNOSIS — Z01810 Encounter for preprocedural cardiovascular examination: Secondary | ICD-10-CM | POA: Diagnosis not present

## 2017-11-27 HISTORY — DX: Anxiety disorder, unspecified: F41.9

## 2017-11-27 HISTORY — DX: Unspecified osteoarthritis, unspecified site: M19.90

## 2017-11-27 HISTORY — DX: Bipolar disorder, unspecified: F31.9

## 2017-11-27 LAB — CBC
HCT: 49.3 % (ref 39.0–52.0)
HEMOGLOBIN: 16.8 g/dL (ref 13.0–17.0)
MCH: 30.8 pg (ref 26.0–34.0)
MCHC: 34.1 g/dL (ref 30.0–36.0)
MCV: 90.5 fL (ref 78.0–100.0)
Platelets: 164 10*3/uL (ref 150–400)
RBC: 5.45 MIL/uL (ref 4.22–5.81)
RDW: 13.3 % (ref 11.5–15.5)
WBC: 5.5 10*3/uL (ref 4.0–10.5)

## 2017-11-27 LAB — BASIC METABOLIC PANEL
ANION GAP: 10 (ref 5–15)
BUN: 13 mg/dL (ref 6–20)
CHLORIDE: 103 mmol/L (ref 101–111)
CO2: 25 mmol/L (ref 22–32)
Calcium: 9.2 mg/dL (ref 8.9–10.3)
Creatinine, Ser: 0.8 mg/dL (ref 0.61–1.24)
GFR calc non Af Amer: >60 mL/min (ref >60)
GLUCOSE: 140 mg/dL — AB (ref 65–99)
Potassium: 4 mmol/L (ref 3.5–5.1)
Sodium: 138 mmol/L (ref 135–145)

## 2017-11-27 LAB — URINALYSIS, ROUTINE W REFLEX MICROSCOPIC
BILIRUBIN URINE: NEGATIVE
GLUCOSE, UA: NEGATIVE mg/dL
HGB URINE DIPSTICK: NEGATIVE
Ketones, ur: NEGATIVE mg/dL
Leukocytes, UA: NEGATIVE
Nitrite: NEGATIVE
PH: 5 (ref 5.0–8.0)
Protein, ur: NEGATIVE mg/dL
SPECIFIC GRAVITY, URINE: 1.024 (ref 1.005–1.030)

## 2017-11-27 LAB — SURGICAL PCR SCREEN
MRSA, PCR: NEGATIVE
Staphylococcus aureus: POSITIVE — AB

## 2017-11-27 MED ORDER — CHLORHEXIDINE GLUCONATE 4 % EX LIQD: 60.0000 mL | Freq: Once | CUTANEOUS | Status: DC

## 2017-11-28 LAB — URINE CULTURE: Culture: NO GROWTH

## 2017-12-08 MED ORDER — TRANEXAMIC ACID 1000 MG/10ML IV SOLN
1000.0000 mg | INTRAVENOUS | Status: AC
  Administered 2017-12-09: 1000 mg via INTRAVENOUS
  Filled 2017-12-08: qty 1100

## 2017-12-08 MED ORDER — CLINDAMYCIN PHOSPHATE 900 MG/50ML IV SOLN
900.0000 mg | INTRAVENOUS | Status: AC
  Administered 2017-12-09: 900 mg via INTRAVENOUS
  Filled 2017-12-08: qty 50

## 2017-12-08 NOTE — H&P (Signed)
TOTAL HIP ADMISSION H&P  Patient is admitted for right total hip arthroplasty.  Subjective:  Chief Complaint: right hip pain  HPI: Chad Wagner, 52 y.o. male, has a history of pain and functional disability in the right hip(s) due to arthritis and patient has failed non-surgical conservative treatments for greater than 12 weeks to include NSAID's and/or analgesics, flexibility and strengthening excercises and activity modification.  Onset of symptoms was gradual starting 8 years ago with gradually worsening course since that time.The patient noted no past surgery on the right hip(s).  Patient currently rates pain in the right hip at 8 out of 10 with activity. Patient has night pain, worsening of pain with activity and weight bearing, trendelenberg gait and pain that interfers with activities of daily living. Patient has evidence of subchondral cysts, subchondral sclerosis and joint space narrowing by imaging studies. This condition presents safety issues increasing the risk of falls. This patient has had A good result with his left total hip replacement.  There is no current active infection.  Patient Active Problem List   Diagnosis Date Noted  . Hip arthritis 01/30/2016   Past Medical History:  Diagnosis Date  . Anger   . Anxiety   . Arthritis   . Asthma    MILD  DUE TO ALLERGIES   (RESOLVED)  . Bipolar disorder (HCC)   . Depression   . Varicose vein of leg     Past Surgical History:  Procedure Laterality Date  . APPENDECTOMY     1985  . SHOULDER ARTHROSCOPY WITH OPEN ROTATOR CUFF REPAIR AND DISTAL CLAVICLE ACROMINECTOMY Right 11/02/2015   Procedure: SHOULDER DIAGNOSTIC OPERATIVE ARTHROSCOPY WITH MINI-OPEN ROTATOR CUFF REPAIR, DISTAL CLAVICLE EXCISION,  BICEPS TENODESIS.  ;  Surgeon: Cammy Copa, MD;  Location: [facility];  Service: Orthopedics;  Laterality: Right;  . TONSILLECTOMY     T+A 1980  . TOTAL HIP ARTHROPLASTY Left 01/30/2016   Procedure: LEFT TOTAL HIP ARTHROPLASTY  ANTERIOR APPROACH;  Surgeon: Cammy Copa, MD;  [facility]: [facility];  Service: Orthopedics;  Laterality: Left;  Marland Kitchen VARICOSE VEIN SURGERY     1993  . VASECTOMY     2010    Current Facility-Administered Medications  Medication Dose Route Frequency Provider Last Rate Last Dose  . [START ON 12/09/2017] clindamycin (CLEOCIN) IVPB 900 mg  900 mg Intravenous To [facility]-Surg Cammy Copa, MD      . Melene Muller ON 12/09/2017] tranexamic acid (CYKLOKAPRON) 1,000 mg in sodium chloride 0.9 % 100 mL IVPB  1,000 mg Intravenous To OR August Saucer Corrie Mckusick, MD       Current Outpatient Medications  Medication Sig Dispense Refill Last Dose  . buPROPion (WELLBUTRIN XL) 300 MG 24 hr tablet Take 300 mg by mouth every morning.     . cetirizine (ZYRTEC) 10 MG tablet Take 10 mg by mouth daily.   Taking  . lurasidone (LATUDA) 40 MG TABS tablet Take 40 mg by mouth at bedtime.     . Multiple Vitamins-Minerals (MULTIVITAMIN WITH MINERALS) tablet Take 1 tablet by mouth daily.     Marland Kitchen VITAMIN D3 SUPER STRENGTH 2000 units tablet Take 2,000 Units by mouth daily.   Taking  . acetaminophen (TYLENOL) 500 MG tablet Take 1,000 mg by mouth 2 (two) times daily as needed for mild pain.   Taking  . aspirin EC 81 MG tablet Take 81 mg by mouth daily.     . busPIRone (BUSPAR) 10 MG tablet Take 10 mg by mouth 2 (two)  times daily.      Allergies  Allergen Reactions  . Penicillins Anaphylaxis and Swelling    Noted 01/2013 - swollen gland in tongue  PATIENT HAS HAD A PCN REACTION WITH IMMEDIATE RASH, FACIAL/TONGUE/THROAT SWELLING, SOB, OR LIGHTHEADEDNESS WITH HYPOTENSION:  #  #  #  YES  #  #  #   Has patient had a PCN reaction causing severe rash involving mucus membranes or skin necrosis: No Has patient had a PCN reaction that required hospitalization No Has patient had a PCN reaction occurring within the last 10 years: No     Social History   Tobacco Use  . Smoking status: Former Games developermoker  . Smokeless tobacco: Never Used  . Tobacco  comment: quit over 20+ years ago and only occasionally smoked then  Substance Use Topics  . Alcohol use: Yes    Alcohol/week: 0.0 oz    Types: 4 - 5 Glasses of wine per week    No family history on file.   Review of Systems  Musculoskeletal: Positive for joint pain.  All other systems reviewed and are negative.   Objective:  Physical Exam  Constitutional: He appears well-developed.  HENT:  Head: Normocephalic.  Eyes: Pupils are equal, round, and reactive to light.  Neck: Normal range of motion.  Cardiovascular: Normal rate.  Respiratory: Effort normal.  Neurological: He is alert.  Skin: Skin is warm.  Psychiatric: He has a normal mood and affect.  Examination of the right hip demonstrates groin pain with internal/external rotation of the hip.  Motor sensory function to the foot is intact with palpable pedal pulses.  There is good hip flexion strength bilaterally.  Leg lengths approximately equal.  Vital signs in last 24 hours:    Labs:   Estimated body mass index is 37.35 kg/m as calculated from the following:   Height as of 11/27/17: 5' 10.75" (1.797 m).   Weight as of 11/27/17: 265 lb 14.4 oz (120.6 kg).   Imaging Review Plain radiographs demonstrate moderate degenerative joint disease of the right hip(s). The bone quality appears to be excellent for age and reported activity level.  Assessment/Plan:  End stage arthritis, right hip(s)  The patient history, physical examination, clinical judgement of the provider and imaging studies are consistent with end stage degenerative joint disease of the right hip(s) and total hip arthroplasty is deemed medically necessary. The treatment options including medical management, injection therapy, arthroscopy and arthroplasty were discussed at length. The risks and benefits of total hip arthroplasty were presented and reviewed. The risks due to aseptic loosening, infection, stiffness, dislocation/subluxation,  thromboembolic  complications and other imponderables were discussed.  The patient acknowledged the explanation, agreed to proceed with the plan and consent was signed. Patient is being admitted for inpatient treatment for surgery, pain control, PT, OT, prophylactic antibiotics, VTE prophylaxis, progressive ambulation and ADL's and discharge planning.The patient is planning to be discharged home with home health services

## 2017-12-09 ENCOUNTER — Inpatient Hospital Stay (HOSPITAL_COMMUNITY): Payer: Non-veteran care | Admitting: Certified Registered Nurse Anesthetist

## 2017-12-09 ENCOUNTER — Inpatient Hospital Stay (HOSPITAL_COMMUNITY): Payer: Non-veteran care

## 2017-12-09 ENCOUNTER — Inpatient Hospital Stay (HOSPITAL_COMMUNITY)
Admission: RE | Admit: 2017-12-09 | Discharge: 2017-12-12 | DRG: 470 | Disposition: A | Discharge status: Home Health | Payer: Non-veteran care | Source: Ambulatory Visit | Attending: Orthopedic Surgery | Admitting: Orthopedic Surgery

## 2017-12-09 ENCOUNTER — Encounter (HOSPITAL_COMMUNITY): Payer: Self-pay | Admitting: Certified Registered Nurse Anesthetist

## 2017-12-09 ENCOUNTER — Encounter (HOSPITAL_COMMUNITY)
Admission: RE | Disposition: A | Discharge status: Home Health | Payer: Self-pay | Source: Ambulatory Visit | Attending: Orthopedic Surgery

## 2017-12-09 DIAGNOSIS — Z96642 Presence of left artificial hip joint: Secondary | ICD-10-CM | POA: Diagnosis present

## 2017-12-09 DIAGNOSIS — Z419 Encounter for procedure for purposes other than remedying health state, unspecified: Secondary | ICD-10-CM

## 2017-12-09 DIAGNOSIS — Z7982 Long term (current) use of aspirin: Secondary | ICD-10-CM | POA: Diagnosis not present

## 2017-12-09 DIAGNOSIS — E669 Obesity, unspecified: Secondary | ICD-10-CM | POA: Diagnosis present

## 2017-12-09 DIAGNOSIS — Z6837 Body mass index (BMI) 37.0-37.9, adult: Secondary | ICD-10-CM

## 2017-12-09 DIAGNOSIS — M25551 Pain in right hip: Secondary | ICD-10-CM | POA: Diagnosis present

## 2017-12-09 DIAGNOSIS — Z87891 Personal history of nicotine dependence: Secondary | ICD-10-CM | POA: Diagnosis not present

## 2017-12-09 DIAGNOSIS — F319 Bipolar disorder, unspecified: Secondary | ICD-10-CM | POA: Diagnosis present

## 2017-12-09 DIAGNOSIS — M1610 Unilateral primary osteoarthritis, unspecified hip: Secondary | ICD-10-CM

## 2017-12-09 DIAGNOSIS — M1611 Unilateral primary osteoarthritis, right hip: Secondary | ICD-10-CM

## 2017-12-09 HISTORY — PX: TOTAL HIP ARTHROPLASTY: SHX124

## 2017-12-09 SURGERY — ARTHROPLASTY, HIP, TOTAL, ANTERIOR APPROACH
Anesthesia: General | Site: Hip | Laterality: Right

## 2017-12-09 MED ORDER — TRANEXAMIC ACID 1000 MG/10ML IV SOLN
2000.0000 mg | INTRAVENOUS | Status: AC
  Administered 2017-12-09: 2000 mg via TOPICAL
  Filled 2017-12-09: qty 20

## 2017-12-09 MED ORDER — OXYCODONE HCL 5 MG/5ML PO SOLN: 5.0000 mg | Freq: Once | ORAL | Status: DC | PRN

## 2017-12-09 MED ORDER — SUGAMMADEX SODIUM 500 MG/5ML IV SOLN
INTRAVENOUS | Status: DC | PRN
  Administered 2017-12-09: 300 mg via INTRAVENOUS

## 2017-12-09 MED ORDER — ONDANSETRON HCL 4 MG/2ML IJ SOLN
INTRAMUSCULAR | Status: DC | PRN
  Administered 2017-12-09: 4 mg via INTRAVENOUS

## 2017-12-09 MED ORDER — MORPHINE SULFATE (PF) 4 MG/ML IV SOLN
INTRAVENOUS | Status: DC | PRN
  Administered 2017-12-09: 8 mg

## 2017-12-09 MED ORDER — MIDAZOLAM HCL 5 MG/5ML IJ SOLN
INTRAMUSCULAR | Status: DC | PRN
  Administered 2017-12-09: 2 mg via INTRAVENOUS

## 2017-12-09 MED ORDER — PROMETHAZINE HCL 25 MG/ML IJ SOLN: 6.2500 mg | INTRAMUSCULAR | Status: DC | PRN

## 2017-12-09 MED ORDER — POTASSIUM CHLORIDE IN NACL 20-0.9 MEQ/L-% IV SOLN: INTRAVENOUS | Status: DC

## 2017-12-09 MED ORDER — SUGAMMADEX SODIUM 500 MG/5ML IV SOLN
INTRAVENOUS | Status: AC
  Filled 2017-12-09: qty 5

## 2017-12-09 MED ORDER — FENTANYL CITRATE (PF) 100 MCG/2ML IJ SOLN
INTRAMUSCULAR | Status: AC
  Filled 2017-12-09: qty 2

## 2017-12-09 MED ORDER — BUPROPION HCL ER (XL) 150 MG PO TB24
300.0000 mg | ORAL_TABLET | ORAL | Status: DC
  Administered 2017-12-10 – 2017-12-12 (×3): 300 mg via ORAL
  Filled 2017-12-09 (×3): qty 2

## 2017-12-09 MED ORDER — GABAPENTIN 300 MG PO CAPS
300.0000 mg | ORAL_CAPSULE | Freq: Three times a day (TID) | ORAL | Status: DC
  Administered 2017-12-09 – 2017-12-12 (×8): 300 mg via ORAL
  Filled 2017-12-09 (×8): qty 1

## 2017-12-09 MED ORDER — PROPOFOL 10 MG/ML IV BOLUS
INTRAVENOUS | Status: DC | PRN
  Administered 2017-12-09: 200 mg via INTRAVENOUS

## 2017-12-09 MED ORDER — PHENYLEPHRINE 40 MCG/ML (10ML) SYRINGE FOR IV PUSH (FOR BLOOD PRESSURE SUPPORT)
PREFILLED_SYRINGE | INTRAVENOUS | Status: AC
  Filled 2017-12-09: qty 10

## 2017-12-09 MED ORDER — FENTANYL CITRATE (PF) 250 MCG/5ML IJ SOLN
INTRAMUSCULAR | Status: AC
  Filled 2017-12-09: qty 5

## 2017-12-09 MED ORDER — HYDROMORPHONE HCL 1 MG/ML IJ SOLN
0.5000 mg | INTRAMUSCULAR | Status: DC | PRN
  Administered 2017-12-09 – 2017-12-11 (×9): 1 mg via INTRAVENOUS
  Filled 2017-12-09 (×9): qty 1

## 2017-12-09 MED ORDER — LIDOCAINE 2% (20 MG/ML) 5 ML SYRINGE
INTRAMUSCULAR | Status: AC
  Filled 2017-12-09: qty 10

## 2017-12-09 MED ORDER — ALBUMIN HUMAN 5 % IV SOLN
INTRAVENOUS | Status: DC | PRN
  Administered 2017-12-09 (×2): via INTRAVENOUS

## 2017-12-09 MED ORDER — FENTANYL CITRATE (PF) 100 MCG/2ML IJ SOLN
25.0000 ug | INTRAMUSCULAR | Status: DC | PRN
  Administered 2017-12-09 (×2): 50 ug via INTRAVENOUS

## 2017-12-09 MED ORDER — CLONIDINE HCL (ANALGESIA) 100 MCG/ML EP SOLN
150.0000 ug | Freq: Once | EPIDURAL | Status: AC
  Administered 2017-12-09: 1 mL via INTRA_ARTICULAR
  Filled 2017-12-09: qty 10

## 2017-12-09 MED ORDER — ONDANSETRON HCL 4 MG/2ML IJ SOLN: 4.0000 mg | Freq: Four times a day (QID) | INTRAMUSCULAR | Status: DC | PRN

## 2017-12-09 MED ORDER — DIPHENHYDRAMINE HCL 50 MG/ML IJ SOLN
INTRAMUSCULAR | Status: AC
  Filled 2017-12-09: qty 1

## 2017-12-09 MED ORDER — LURASIDONE HCL 40 MG PO TABS
40.0000 mg | ORAL_TABLET | Freq: Every day | ORAL | Status: DC
  Administered 2017-12-09 – 2017-12-11 (×3): 40 mg via ORAL
  Filled 2017-12-09 (×3): qty 1

## 2017-12-09 MED ORDER — ROCURONIUM BROMIDE 100 MG/10ML IV SOLN
INTRAVENOUS | Status: DC | PRN
  Administered 2017-12-09: 10 mg via INTRAVENOUS
  Administered 2017-12-09: 30 mg via INTRAVENOUS
  Administered 2017-12-09: 10 mg via INTRAVENOUS
  Administered 2017-12-09: 60 mg via INTRAVENOUS

## 2017-12-09 MED ORDER — POTASSIUM CHLORIDE IN NACL 20-0.9 MEQ/L-% IV SOLN
INTRAVENOUS | Status: AC
  Administered 2017-12-10: 07:00:00 via INTRAVENOUS
  Filled 2017-12-09: qty 1000

## 2017-12-09 MED ORDER — FENTANYL CITRATE (PF) 100 MCG/2ML IJ SOLN
INTRAMUSCULAR | Status: DC | PRN
  Administered 2017-12-09 (×2): 50 ug via INTRAVENOUS
  Administered 2017-12-09: 150 ug via INTRAVENOUS
  Administered 2017-12-09: 100 ug via INTRAVENOUS
  Administered 2017-12-09 (×3): 50 ug via INTRAVENOUS

## 2017-12-09 MED ORDER — DOCUSATE SODIUM 100 MG PO CAPS
100.0000 mg | ORAL_CAPSULE | Freq: Two times a day (BID) | ORAL | Status: DC
  Administered 2017-12-09 – 2017-12-12 (×6): 100 mg via ORAL
  Filled 2017-12-09 (×6): qty 1

## 2017-12-09 MED ORDER — VANCOMYCIN HCL IN DEXTROSE 1-5 GM/200ML-% IV SOLN
1000.0000 mg | Freq: Two times a day (BID) | INTRAVENOUS | Status: AC
  Administered 2017-12-09: 1000 mg via INTRAVENOUS
  Filled 2017-12-09: qty 200

## 2017-12-09 MED ORDER — MORPHINE SULFATE (PF) 4 MG/ML IV SOLN
INTRAVENOUS | Status: AC
  Filled 2017-12-09: qty 2

## 2017-12-09 MED ORDER — BUPIVACAINE LIPOSOME 1.3 % IJ SUSP
20.0000 mL | INTRAMUSCULAR | Status: AC
  Administered 2017-12-09: 20 mL
  Filled 2017-12-09: qty 20

## 2017-12-09 MED ORDER — METOCLOPRAMIDE HCL 5 MG/ML IJ SOLN: 5.0000 mg | Freq: Three times a day (TID) | INTRAMUSCULAR | Status: DC | PRN

## 2017-12-09 MED ORDER — METOCLOPRAMIDE HCL 5 MG PO TABS: 5.0000 mg | ORAL_TABLET | Freq: Three times a day (TID) | ORAL | Status: DC | PRN

## 2017-12-09 MED ORDER — PHENOL 1.4 % MT LIQD: 1.0000 | OROMUCOSAL | Status: DC | PRN

## 2017-12-09 MED ORDER — METHOCARBAMOL 1000 MG/10ML IJ SOLN
500.0000 mg | Freq: Four times a day (QID) | INTRAMUSCULAR | Status: DC | PRN
  Filled 2017-12-09: qty 5

## 2017-12-09 MED ORDER — DEXAMETHASONE SODIUM PHOSPHATE 10 MG/ML IJ SOLN
INTRAMUSCULAR | Status: AC
  Filled 2017-12-09: qty 1

## 2017-12-09 MED ORDER — MENTHOL 3 MG MT LOZG: 1.0000 | LOZENGE | OROMUCOSAL | Status: DC | PRN

## 2017-12-09 MED ORDER — VITAMIN D 1000 UNITS PO TABS
2000.0000 [IU] | ORAL_TABLET | Freq: Every day | ORAL | Status: DC
  Administered 2017-12-09 – 2017-12-12 (×4): 2000 [IU] via ORAL
  Filled 2017-12-09 (×4): qty 2

## 2017-12-09 MED ORDER — LACTATED RINGERS IV SOLN
INTRAVENOUS | Status: DC | PRN
  Administered 2017-12-09 (×2): via INTRAVENOUS

## 2017-12-09 MED ORDER — MIDAZOLAM HCL 2 MG/2ML IJ SOLN
INTRAMUSCULAR | Status: AC
  Filled 2017-12-09: qty 2

## 2017-12-09 MED ORDER — BUPIVACAINE HCL (PF) 0.25 % IJ SOLN
INTRAMUSCULAR | Status: AC
  Filled 2017-12-09: qty 60

## 2017-12-09 MED ORDER — ONDANSETRON HCL 4 MG/2ML IJ SOLN
INTRAMUSCULAR | Status: AC
  Filled 2017-12-09: qty 2

## 2017-12-09 MED ORDER — ACETAMINOPHEN 500 MG PO TABS
1000.0000 mg | ORAL_TABLET | Freq: Four times a day (QID) | ORAL | Status: AC
  Administered 2017-12-09 – 2017-12-10 (×4): 1000 mg via ORAL
  Filled 2017-12-09 (×4): qty 2

## 2017-12-09 MED ORDER — METHOCARBAMOL 500 MG PO TABS
500.0000 mg | ORAL_TABLET | Freq: Four times a day (QID) | ORAL | Status: DC | PRN
  Administered 2017-12-09 – 2017-12-12 (×8): 500 mg via ORAL
  Filled 2017-12-09 (×8): qty 1

## 2017-12-09 MED ORDER — BUSPIRONE HCL 10 MG PO TABS
10.0000 mg | ORAL_TABLET | Freq: Two times a day (BID) | ORAL | Status: DC
  Administered 2017-12-09 – 2017-12-12 (×6): 10 mg via ORAL
  Filled 2017-12-09 (×7): qty 2

## 2017-12-09 MED ORDER — ASPIRIN 81 MG PO CHEW
81.0000 mg | CHEWABLE_TABLET | Freq: Two times a day (BID) | ORAL | Status: DC
  Administered 2017-12-09 – 2017-12-12 (×6): 81 mg via ORAL
  Filled 2017-12-09 (×6): qty 1

## 2017-12-09 MED ORDER — LIDOCAINE HCL (CARDIAC) 20 MG/ML IV SOLN
INTRAVENOUS | Status: DC | PRN
  Administered 2017-12-09: 80 mg via INTRAVENOUS

## 2017-12-09 MED ORDER — OXYCODONE HCL 5 MG PO TABS
5.0000 mg | ORAL_TABLET | ORAL | Status: DC | PRN
  Administered 2017-12-09 – 2017-12-12 (×13): 10 mg via ORAL
  Filled 2017-12-09 (×13): qty 2

## 2017-12-09 MED ORDER — PROPOFOL 10 MG/ML IV BOLUS
INTRAVENOUS | Status: AC
  Filled 2017-12-09: qty 20

## 2017-12-09 MED ORDER — ROCURONIUM BROMIDE 10 MG/ML (PF) SYRINGE
PREFILLED_SYRINGE | INTRAVENOUS | Status: AC
  Filled 2017-12-09: qty 25

## 2017-12-09 MED ORDER — ONDANSETRON HCL 4 MG PO TABS: 4.0000 mg | ORAL_TABLET | Freq: Four times a day (QID) | ORAL | Status: DC | PRN

## 2017-12-09 MED ORDER — DEXAMETHASONE SODIUM PHOSPHATE 10 MG/ML IJ SOLN
INTRAMUSCULAR | Status: DC | PRN
  Administered 2017-12-09: 10 mg via INTRAVENOUS

## 2017-12-09 MED ORDER — OXYCODONE HCL 5 MG PO TABS: 5.0000 mg | ORAL_TABLET | Freq: Once | ORAL | Status: DC | PRN

## 2017-12-09 MED ORDER — DIPHENHYDRAMINE HCL 50 MG/ML IJ SOLN
INTRAMUSCULAR | Status: DC | PRN
  Administered 2017-12-09: 12.5 mg via INTRAVENOUS

## 2017-12-09 MED ORDER — 0.9 % SODIUM CHLORIDE (POUR BTL) OPTIME
TOPICAL | Status: DC | PRN
  Administered 2017-12-09 (×2): 1000 mL

## 2017-12-09 MED ORDER — LACTATED RINGERS IV SOLN
INTRAVENOUS | Status: DC
  Administered 2017-12-09: 11:00:00 via INTRAVENOUS

## 2017-12-09 SURGICAL SUPPLY — 59 items
BAG BILE T-TUBES STRL (MISCELLANEOUS) IMPLANT
BAG DECANTER FOR FLEXI CONT (MISCELLANEOUS) IMPLANT
BLADE CLIPPER SURG (BLADE) ×2 IMPLANT
BLADE SAW SGTL 18X1.27X75 (BLADE) ×2 IMPLANT
BNDG COHESIVE 4X5 TAN STRL (GAUZE/BANDAGES/DRESSINGS) ×2 IMPLANT
CANISTER OMNI JUG 16 LITER (MISCELLANEOUS) ×2 IMPLANT
CANISTER SUCTION 1500CC (MISCELLANEOUS) ×2 IMPLANT
CAPT HIP TOTAL 2 ×2 IMPLANT
CELLS DAT CNTRL 66122 CELL SVR (MISCELLANEOUS) ×1 IMPLANT
CLSR STERI-STRIP ANTIMIC 1/2X4 (GAUZE/BANDAGES/DRESSINGS) ×2 IMPLANT
COVER PERINEAL POST (MISCELLANEOUS) ×2 IMPLANT
COVER SURGICAL LIGHT HANDLE (MISCELLANEOUS) ×2 IMPLANT
DRAPE C-ARM 42X72 X-RAY (DRAPES) ×2 IMPLANT
DRAPE HALF SHEET 40X57 (DRAPES) ×2 IMPLANT
DRAPE INCISE IOBAN 66X45 STRL (DRAPES) ×2 IMPLANT
DRAPE STERI IOBAN 125X83 (DRAPES) ×2 IMPLANT
DRAPE U-SHAPE 47X51 STRL (DRAPES) ×4 IMPLANT
DRSG AQUACEL AG ADV 3.5X10 (GAUZE/BANDAGES/DRESSINGS) ×2 IMPLANT
DURAPREP 26ML APPLICATOR (WOUND CARE) ×2 IMPLANT
ELECT BLADE 4.0 EZ CLEAN MEGAD (MISCELLANEOUS)
ELECT BLADE 6.5 EXT (BLADE) ×2 IMPLANT
ELECT REM PT RETURN 9FT ADLT (ELECTROSURGICAL) ×2
ELECTRODE BLDE 4.0 EZ CLN MEGD (MISCELLANEOUS) IMPLANT
ELECTRODE REM PT RTRN 9FT ADLT (ELECTROSURGICAL) ×1 IMPLANT
GLOVE BIOGEL PI IND STRL 6.5 (GLOVE) ×2 IMPLANT
GLOVE BIOGEL PI IND STRL 7.5 (GLOVE) ×1 IMPLANT
GLOVE BIOGEL PI IND STRL 8 (GLOVE) ×1 IMPLANT
GLOVE BIOGEL PI INDICATOR 6.5 (GLOVE) ×2
GLOVE BIOGEL PI INDICATOR 7.5 (GLOVE) ×1
GLOVE BIOGEL PI INDICATOR 8 (GLOVE) ×1
GLOVE BIOGEL PI ORTHO PRO SZ8 (GLOVE) ×2
GLOVE ECLIPSE 7.0 STRL STRAW (GLOVE) ×2 IMPLANT
GLOVE PI ORTHO PRO STRL SZ8 (GLOVE) ×2 IMPLANT
GLOVE SURG SS PI 6.5 STRL IVOR (GLOVE) ×4 IMPLANT
GOWN STRL REUS W/ TWL LRG LVL3 (GOWN DISPOSABLE) ×5 IMPLANT
GOWN STRL REUS W/TWL LRG LVL3 (GOWN DISPOSABLE) ×5
HANDPIECE INTERPULSE COAX TIP (DISPOSABLE) ×1
HOOD PEEL AWAY FLYTE STAYCOOL (MISCELLANEOUS) ×8 IMPLANT
KIT BASIN OR (CUSTOM PROCEDURE TRAY) ×2 IMPLANT
KIT ROOM TURNOVER OR (KITS) ×2 IMPLANT
NEEDLE HYPO 22GX1.5 SAFETY (NEEDLE) ×2 IMPLANT
NS IRRIG 1000ML POUR BTL (IV SOLUTION) ×2 IMPLANT
PACK TOTAL JOINT (CUSTOM PROCEDURE TRAY) ×2 IMPLANT
PAD ARMBOARD 7.5X6 YLW CONV (MISCELLANEOUS) ×2 IMPLANT
RTRCTR WOUND ALEXIS 18CM MED (MISCELLANEOUS) ×2
SET HNDPC FAN SPRY TIP SCT (DISPOSABLE) ×1 IMPLANT
SPONGE LAP 18X18 X RAY DECT (DISPOSABLE) ×2 IMPLANT
STRIP CLOSURE SKIN 1/2X4 (GAUZE/BANDAGES/DRESSINGS) IMPLANT
SUT ETHIBOND NAB CT1 #1 30IN (SUTURE) ×6 IMPLANT
SUT MNCRL AB 3-0 PS2 18 (SUTURE) ×4 IMPLANT
SUT VIC AB 0 CT1 27 (SUTURE) ×2
SUT VIC AB 0 CT1 27XBRD ANBCTR (SUTURE) ×2 IMPLANT
SUT VIC AB 1 CTX 27 (SUTURE) ×2 IMPLANT
SUT VIC AB 2-0 CT1 27 (SUTURE) ×1
SUT VIC AB 2-0 CT1 TAPERPNT 27 (SUTURE) ×1 IMPLANT
SYR 30ML LL (SYRINGE) ×2 IMPLANT
TOWEL OR 17X24 6PK STRL BLUE (TOWEL DISPOSABLE) ×2 IMPLANT
TOWEL OR 17X26 10 PK STRL BLUE (TOWEL DISPOSABLE) ×2 IMPLANT
WATER STERILE IRR 1000ML POUR (IV SOLUTION) IMPLANT

## 2017-12-09 NOTE — Anesthesia Preprocedure Evaluation (Addendum)
Anesthesia Evaluation  Patient identified by MRN, date of birth, ID band Patient awake    Reviewed: Allergy & Precautions, NPO status , Patient's Chart, lab work & pertinent test results  Airway Mallampati: II   Neck ROM: Full    Dental  (+) Dental Advisory Given   Pulmonary asthma , former smoker,    breath sounds clear to auscultation       Cardiovascular + Peripheral Vascular Disease   Rhythm:Regular Rate:Normal     Neuro/Psych Anxiety Depression Bipolar Disorder negative neurological ROS     GI/Hepatic negative GI ROS, Neg liver ROS,   Endo/Other  obesity  Renal/GU negative Renal ROS  negative genitourinary   Musculoskeletal  (+) Arthritis ,   Abdominal   Peds  Hematology negative hematology ROS (+)   Anesthesia Other Findings   Reproductive/Obstetrics                            Anesthesia Physical  Anesthesia Plan  ASA: II  Anesthesia Plan: General   Post-op Pain Management:    Induction: Intravenous  PONV Risk Score and Plan: 3 and Treatment may vary due to age or medical condition, Dexamethasone, Ondansetron and Midazolam  Airway Management Planned: Oral ETT  Additional Equipment: None  Intra-op Plan:   Post-operative Plan: Extubation in OR  Informed Consent: I have reviewed the patients History and Physical, chart, labs and discussed the procedure including the risks, benefits and alternatives for the proposed anesthesia with the patient or authorized representative who has indicated his/her understanding and acceptance.   Dental advisory given  Plan Discussed with: CRNA  Anesthesia Plan Comments:        Anesthesia Quick Evaluation

## 2017-12-09 NOTE — Anesthesia Postprocedure Evaluation (Signed)
Anesthesia Post Note  Patient: Chad Wagner  Procedure(s) Performed: RIGHT TOTAL HIP ARTHROPLASTY ANTERIOR APPROACH (Right Hip)     Patient location during evaluation: PACU Anesthesia Type: General Level of consciousness: awake and alert Pain management: pain level controlled Vital Signs Assessment: post-procedure vital signs reviewed and stable Respiratory status: spontaneous breathing, nonlabored ventilation and respiratory function stable Cardiovascular status: blood pressure returned to baseline and stable Postop Assessment: no apparent nausea or vomiting Anesthetic complications: no    Last Vitals:  Vitals:   12/09/17 1801 12/09/17 1937  BP: 126/75 138/75  Pulse: 85 90  Resp:  18  Temp: 36.9 C 36.6 C  SpO2: 95% 98%    Last Pain:  Vitals:   12/09/17 1937  TempSrc: Oral  PainSc:                  Beryle Lathehomas E Brock

## 2017-12-09 NOTE — Anesthesia Procedure Notes (Signed)
Procedure Name: Intubation Date/Time: 12/09/2017 12:51 PM Performed by: Inda Coke, CRNA Pre-anesthesia Checklist: Patient identified, Emergency Drugs available, Suction available and Patient being monitored Patient Re-evaluated:Patient Re-evaluated prior to induction Oxygen Delivery Method: Circle System Utilized Preoxygenation: Pre-oxygenation with 100% oxygen Induction Type: IV induction Ventilation: Mask ventilation without difficulty and Oral airway inserted - appropriate to patient size Laryngoscope Size: Mac and 4 Grade View: Grade II Tube type: Oral Tube size: 7.5 mm Number of attempts: 1 Airway Equipment and Method: Stylet and Oral airway Placement Confirmation: ETT inserted through vocal cords under direct vision,  positive ETCO2 and breath sounds checked- equal and bilateral Secured at: 23 cm Tube secured with: Tape Dental Injury: Teeth and Oropharynx as per pre-operative assessment

## 2017-12-09 NOTE — Transfer of Care (Signed)
Immediate Anesthesia Transfer of Care Note  Patient: Chad Wagner  Procedure(s) Performed: RIGHT TOTAL HIP ARTHROPLASTY ANTERIOR APPROACH (Right Hip)  Patient Location: PACU  Anesthesia Type:General  Level of Consciousness: awake and alert   Airway & Oxygen Therapy: Patient Spontanous Breathing and Patient connected to nasal cannula oxygen  Post-op Assessment: Report given to RN and Post -op Vital signs reviewed and stable  Post vital signs: Reviewed and stable  Last Vitals:  Vitals:   12/09/17 1025 12/09/17 1658  BP: (!) 141/86   Pulse: (!) 58   Resp: 18   Temp: 36.5 C 36.7 C  SpO2: 100%     Last Pain:  Vitals:   12/09/17 1658  TempSrc:   PainSc: Asleep         Complications: No apparent anesthesia complications

## 2017-12-09 NOTE — Brief Op Note (Signed)
12/09/2017  5:07 PM  PATIENT:  Chad Wagner  52 y.o. male  PRE-OPERATIVE DIAGNOSIS:  Right Hip Osteoarthritis  POST-OPERATIVE DIAGNOSIS:  Right Hip Osteoarthritis  PROCEDURE:  Procedure(s): RIGHT TOTAL HIP ARTHROPLASTY ANTERIOR APPROACH  SURGEON:  Surgeon(s): August Saucerean, Corrie MckusickGregory Scott, MD  ASSISTANT: Patrick Jupiterarla Bethune rnfa  ANESTHESIA:   general  EBL: 500 ml    Total I/O In: 2000 [I.V.:1500; IV Piggyback:500] Out: 1000 [Urine:300; Blood:700]  BLOOD ADMINISTERED: none  DRAINS: none   LOCAL MEDICATIONS USED: Marcaine morphine clonidine Exparel SPECIMEN:  No Specimen  COUNTS:  YES  TOURNIQUET:  * No tourniquets in log *  DICTATIO: [id number]: [id number] PLAN OF CARE: Admit to inpatient   PATIENT DISPOSITION:  PACU - hemodynamically stable

## 2017-12-09 NOTE — Interval H&P Note (Signed)
History and Physical Interval Note:  12/09/2017 11:53 AM  Chad Wagner  has presented today for surgery, with the diagnosis of Right Hip Osteoarthritis  The various methods of treatment have been discussed with the patient and family. After consideration of risks, benefits and other options for treatment, the patient has consented to  Procedure(s): RIGHT TOTAL HIP ARTHROPLASTY ANTERIOR APPROACH (Right) as a surgical intervention .  The patient's history has been reviewed, patient examined, no change in status, stable for surgery.  I have reviewed the patient's chart and labs.  Questions were answered to the patient's satisfaction.     Burnard BuntingG Scott Dean

## 2017-12-10 ENCOUNTER — Encounter (HOSPITAL_COMMUNITY): Payer: Self-pay | Admitting: Orthopedic Surgery

## 2017-12-10 NOTE — Evaluation (Signed)
Physical Therapy Evaluation Patient Details Name: Chad LevineMichael I Drotar MRN: [id number] DOB: 10/19/1965 Today's Date: 12/10/2017   History of Present Illness  Pt. is a 52 y.o. M with previous L THA and R shoulder arthroscopy admitted s/p right THA via direct anterior approach.  Clinical Impression  Pt admitted with above diagnosis. Pt currently with functional limitations due to the deficits listed below (see PT Problem List). At the time of PT eval, patient mobility mainly limited by right hip pain and dizziness. Patient BP was 124/66.Patient able to ambulate ~20 feet with RW and min guard.  Based on patient age and previous level of function, expect that once pain is controlled, patient will progress quickly. Pt will benefit from skilled PT to increase their independence and safety with mobility to allow discharge to the venue listed below.       Follow Up Recommendations Follow surgeon's recommendation for DC plan and follow-up therapies    Equipment Recommendations  None recommended by PT    Recommendations for Other Services       Precautions / Restrictions Precautions Precautions: Fall Restrictions Weight Bearing Restrictions: Yes      Mobility  Bed Mobility Overal bed mobility: Modified Independent             General bed mobility comments: Patient performed supine > sit modified independent with HOB elevated  Transfers Overall transfer level: Needs assistance Equipment used: Rolling walker (2 wheeled) Transfers: Sit to/from Stand Sit to Stand: Supervision         General transfer comment: Patient initially performed sit to stand with both hands on RW and patient instructed to push from stable surface to stand in the future.  Ambulation/Gait Ambulation/Gait assistance: Min guard Ambulation Distance (Feet): 20 Feet Assistive device: Rolling walker (2 wheeled) Gait Pattern/deviations: Step-to pattern;Decreased step length - right   Gait velocity interpretation:  Below normal speed for age/gender General Gait Details: Patient instructed on sequencing of gait. Mod reliance of BUE on RW.   Stairs            Wheelchair Mobility    Modified Rankin (Stroke Patients Only)       Balance Overall balance assessment: Needs assistance Sitting-balance support: No upper extremity supported;Feet supported Sitting balance-Leahy Scale: Normal     Standing balance support: Bilateral upper extremity supported Standing balance-Leahy Scale: Poor Standing balance comment: BUE support required                             Pertinent Vitals/Pain Pain Assessment: 0-10 Pain Score: 6  Pain Location: R hip Pain Descriptors / Indicators: Operative site guarding Pain Intervention(s): Monitored during session;RN gave pain meds during session;Ice applied    Home Living Family/patient expects to be discharged to:: Private residence Living Arrangements: Spouse/significant other;Children Available Help at Discharge: Family Type of Home: House Home Access: Stairs to enter Entrance Stairs-Rails: Right;Left(Cannot reach both) Secretary/administratorntrance Stairs-Number of Steps: 6 Home Layout: Two level;Able to live on main level with bedroom/bathroom Home Equipment: Bedside commode;Walker - 2 wheels;Cane - single point Additional Comments: Alternately, patient has 3 steps in the back entrance, but prefers to go through the main entrance.    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     Hand Dominance        Extremity/Trunk Assessment   Upper Extremity Assessment Upper Extremity Assessment: Overall WFL for  tasks assessed    Lower Extremity Assessment Lower Extremity Assessment: RLE deficits/detail;LLE deficits/detail RLE Deficits / Details: Sensation intact to light touch but patient reports numbness in lateral R hip. MMT: Hip flexion 2/5, Knee extension at least 3/5, Dorsiflexion 5/5.  Limited mainly by pain LLE Deficits / Details: Patient demonstrates 5/5 strength.    Cervical / Trunk Assessment Cervical / Trunk Assessment: Normal  Communication   Communication: No difficulties  Cognition Arousal/Alertness: Awake/alert Behavior During Therapy: WFL for tasks assessed/performed Overall Cognitive Status: Within Functional Limits for tasks assessed                                        General Comments General comments (skin integrity, edema, etc.): Patient provided with surgical hip exercise packet. Set up and min assist required for patient donning underwear.    Exercises Total Joint Exercises Ankle Circles/Pumps: 10 reps;Both Quad Sets: Both;20 reps   Assessment/Plan    PT Assessment Patient needs continued PT services  PT Problem List Decreased strength;Decreased range of motion;Decreased balance;Decreased activity tolerance;Decreased mobility;Impaired sensation       PT Treatment Interventions Gait training;Stair training;Functional mobility training;Therapeutic activities;Therapeutic exercise;Balance training;Patient/family education;DME instruction    PT Goals (Current goals can be found in the Care Plan section)  Acute Rehab PT Goals Patient Stated Goal: Climb 6 steps so he can enter his home PT Goal Formulation: With patient Time For Goal Achievement: 12/15/17 Potential to Achieve Goals: Good    Frequency BID   Barriers to discharge        Co-evaluation               AM-PAC PT "6 Clicks" Daily Activity  Outcome Measure Difficulty turning over in bed (including adjusting bedclothes, sheets and blankets)?: A Little Difficulty moving from lying on back to sitting on the side of the bed? : A Little Difficulty sitting down on and standing up from a chair with arms (e.g., wheelchair, bedside commode, etc,.)?: A Little Help needed moving to and from a bed to chair (including a wheelchair)?: A Little Help needed walking in  hospital room?: A Little Help needed climbing 3-5 steps with a railing? : A Lot 6 Click Score: 17    End of Session Equipment Utilized During Treatment: Gait belt Activity Tolerance: Patient tolerated treatment well Patient left: in chair;with call bell/phone within reach Nurse Communication: Mobility status PT Visit Diagnosis: Muscle weakness (generalized) (M62.81);Difficulty in walking, not elsewhere classified (R26.2)    Time: 1610-9604 PT Time Calculation (min) (ACUTE ONLY): 38 min   Charges:   PT Evaluation $PT Eval Moderate Complexity: 1 Mod PT Treatments $Gait Training: 8-22 mins $Therapeutic Activity: 8-22 mins   PT G Codes:        Laurina Bustle, PT, DPT Acute Rehabilitation Services    Vanetta Mulders 12/10/2017, 10:08 AM

## 2017-12-10 NOTE — Care Management Note (Signed)
Case Management Note  Patient Details  Name: Chad Wagner MRN: [id number] Date of Birth: 12/15/1965  Subjective/Objective: 52 yr old gentleman s/p right total hip arthroplasty.                   Action/Plan: Case manager spoke with patient concerning discharge plan. [place] under [facility], CM explained that his Dr. And social worker need to be contacted to arrange for Home Health. Case manager faxed orders, OP note and PT eval to Dr. Coralie Keensywer [phone]. Patient's Child psychotherapistsocial worker at [facility] is Delila PereyraBertina Duncan [phone], [phone]. Case manager will continue to follow. Patient has all necessary DME and will have family support at discharge.    Expected Discharge Date:   12/12/17               Expected Discharge Plan:  Home w Home Health Services  In-House Referral:  NA  Discharge planning Services  CM Consult  Post Acute Care Choice:  Home Health Choice offered to:  [facility]  DME Arranged:  N/A(has RW, 3in1, can and crutches ) DME Agency:  NA  HH Arranged:  PT HH Agency:  (to be determined)  Status of Service:  In process, will continue to follow  If discussed at Long Length of Stay Meetings, dates discussed:    Additional Comments:  Durenda GuthrieBrady, Eloyse Causey Naomi, RN 12/10/2017, 12:49 PM

## 2017-12-10 NOTE — Progress Notes (Signed)
Physical Therapy Treatment Patient Details Name: Chad LevineMichael I Wagner MRN: [id number] DOB: 05/10/1966 Today's Date: 12/10/2017    History of Present Illness Pt. is a 52 y.o. M with previous L THA and R shoulder arthroscopy admitted s/p right THA via direct anterior approach.    PT Comments    Patient is progressing very well towards their physical therapy goals. Patient reports improved pain control this afternoon compared to the morning session. Could benefit from trialing a SPC versus no device during gait based on balance but PT recommended RW if needed for pain control during mobility. Will need stair training prior to discharge to prepare for safe transition home.     Follow Up Recommendations  Follow surgeon's recommendation for DC plan and follow-up therapies     Equipment Recommendations  None recommended by PT    Recommendations for Other Services       Precautions / Restrictions Precautions Precautions: Fall Restrictions Weight Bearing Restrictions: Yes    Mobility  Bed Mobility Overal bed mobility: Modified Independent             General bed mobility comments: Patient performed sit > supine modified independent with HOB elevated.  Transfers Overall transfer level: Needs assistance Equipment used: Rolling walker (2 wheeled) Transfers: Sit to/from Stand Sit to Stand: Supervision         General transfer comment: Patient slightly impulsive and stood from recliner before locks were engaged (brakes were unlocked upon PT arrrival). Patient and family instructed to lock chair before transferring. Toilet transfer performed with supervision.  Ambulation/Gait Ambulation/Gait assistance: Min guard Ambulation Distance (Feet): 200 Feet Assistive device: Rolling walker (2 wheeled) Gait Pattern/deviations: Step-through pattern;Decreased step length - left   Gait velocity interpretation: Below normal speed for age/gender General Gait Details: Patient progressing to  step through pattern and provided VC's for larger L step lengths and smaller R step length. Patient stated he felt that his knees were buckling towards the end of ambulation, but PT did not observe significant knee buckle during stance.     Stairs            Wheelchair Mobility    Modified Rankin (Stroke Patients Only)       Balance Overall balance assessment: Needs assistance Sitting-balance support: No upper extremity supported;Feet supported Sitting balance-Leahy Scale: Normal     Standing balance support: No upper extremity supported Standing balance-Leahy Scale: Good                              Cognition Arousal/Alertness: Awake/alert Behavior During Therapy: WFL for tasks assessed/performed Overall Cognitive Status: Within Functional Limits for tasks assessed                                        Exercises Total Joint Exercises Hip ABduction/ADduction: 20 reps;Right    General Comments        Pertinent Vitals/Pain Pain Assessment: Faces Faces Pain Scale: Hurts a little bit Pain Location: R hip Pain Descriptors / Indicators: Operative site guarding Pain Intervention(s): Premedicated before session    Home Living                      Prior Function            PT Goals (current goals can now be found in the care plan section) Acute  Rehab PT Goals Patient Stated Goal: Climb 6 steps so he can enter his home PT Goal Formulation: With patient Time For Goal Achievement: 12/15/17 Potential to Achieve Goals: Good Progress towards PT goals: Progressing toward goals    Frequency    BID      PT Plan Current plan remains appropriate    Co-evaluation              AM-PAC PT "6 Clicks" Daily Activity  Outcome Measure  Difficulty turning over in bed (including adjusting bedclothes, sheets and blankets)?: A Little Difficulty moving from lying on back to sitting on the side of the bed? : A Little Difficulty  sitting down on and standing up from a chair with arms (e.g., wheelchair, bedside commode, etc,.)?: A Little Help needed moving to and from a bed to chair (including a wheelchair)?: A Little Help needed walking in hospital room?: A Little Help needed climbing 3-5 steps with a railing? : A Little 6 Click Score: 18    End of Session Equipment Utilized During Treatment: Gait belt Activity Tolerance: Patient tolerated treatment well Patient left: with call bell/phone within reach;with family/visitor present;in bed   PT Visit Diagnosis: Muscle weakness (generalized) (M62.81);Difficulty in walking, not elsewhere classified (R26.2)     Time: 1914-7829 PT Time Calculation (min) (ACUTE ONLY): 23 min  Charges:  $Gait Training: 23-37 mins                    G Codes:      Laurina Bustle, PT, DPT Acute Rehabilitation Services    Vanetta Mulders 12/10/2017, 3:26 PM

## 2017-12-10 NOTE — Progress Notes (Signed)
Subjective: Pt stable - pain ok - xrays ok   Objective: Vital signs in last 24 hours: Temp:  [97.7 F (36.5 C)-98.6 F (37 C)] 98.1 F (36.7 C) (03/06 0603) Pulse Rate:  [58-97] 85 (03/06 0603) Resp:  [13-18] 18 (03/06 0603) BP: (120-141)/(65-86) 120/65 (03/06 0603) SpO2:  [95 %-100 %] 95 % (03/06 0603) Weight:  [260 lb (117.9 kg)] 260 lb (117.9 kg) (03/05 1026)  Intake/Output from previous day: 03/05 0701 - 03/06 0700 In: 3440 [P.O.:240; I.V.:2700; IV Piggyback:500] Out: 3000 [Urine:2300; Blood:700] Intake/Output this shift: No intake/output data recorded.  Exam:  Dorsiflexion/Plantar flexion intact  Labs: No results for input(s): HGB in the last 72 hours. No results for input(s): WBC, RBC, HCT, PLT in the last 72 hours. No results for input(s): NA, K, CL, CO2, BUN, CREATININE, GLUCOSE, CALCIUM in the last 72 hours. No results for input(s): LABPT, INR in the last 72 hours.  Assessment/Plan: Plan to mobilize with pt today - dc fri   Burnard BuntingG Scott Kemba Hoppes 12/10/2017, 8:43 AM

## 2017-12-10 NOTE — Op Note (Signed)
Chad Wagner, Chad NO.:  [id number]  MEDICAL RECORD NO.:  [id number]  LOCATION:                                 FACILITY:  PHYSICIAN:  Burnard Bunting, M.D.    DATE OF BIRTH:  20-Mar-1966  DATE OF PROCEDURE:  12/09/2017 DATE OF DISCHARGE:                              OPERATIVE REPORT   PREOPERATIVE DIAGNOSIS:  Right hip arthritis.  POSTOPERATIVE DIAGNOSIS:  Right hip arthritis.  PROCEDURE:  Right total hip replacement using the DePuy press-fit Tri- stem size 12 with +8.5 mm ceramic ball, +4 mm offset polyethylene cup with 54-mm Pinnacle press-fit acetabulum.  SURGEON:  Burnard Bunting, M.D.  ASSISTANT:  Patrick Jupiter, RNFA.  INDICATIONS:  Chad Wagner is a 52 year old patient with right hip pain, presents for operative management of right hip arthritis after explanation of risks and benefits.  PROCEDURE IN DETAIL:  The patient was brought to the operating room, where general anesthesia was induced.  Preoperative antibiotics administered.  Time-out was called.  The patient was examined under anesthesia and fluoroscopy to ensure appropriate positioning on the Hana bed.  Preoperative left hip AP, AP pelvis, and right hip AP were obtained.  These were saved for later reference.  The right hip was then prescrubbed with alcohol and Betadine, allowed to air dry, prepped with DuraPrep solution, and draped in a sterile manner.  [name] was used to cover the operative field.  Time-out was called.  An incision was made approximately 10 cm, which was 2 cm distal and inferior to the anterior superior iliac crest.  Skin and subcutaneous tissue were sharply divided.  The fascia over the tensor fascia lata muscle was divided and retracted anteriorly.  Plane between very well-developed tensor fascia lata and the rectus was developed.  Electrocautery was used to coagulate the crossing circumflex vessels.  Retractor placed behind the superior femoral neck capsule and a 2nd  retractor placed behind the inferior femoral neck capsule.  With these retractors in position, the very thickened capsule of the hip was opened and tagged.  T-shaped incision was made down the intertrochanteric ridge.  Femoral neck cut was then made approximately equal to the laterals to the left side.  This was done with an oscillating saw and completed with an osteotome.  Once this was performed, about 40 degrees of external rotation and slight traction was placed.  Bent 90 retractor was placed anteriorly, which was later replaced by a Kober retractor because of the depth of the acetabular socket.  Posterior retractors were placed as well.  Inferior capsule was released to facilitate mobilization of the femur.  Neck cut was deemed adequate.  At this time, the pulvinar was removed.  Acetabular reaming was then performed beginning with a 49 mm reamer.  This was done under fluoroscopic guidance.  Approximately 45 degrees of abduction and about 20 degrees of anteversion were utilized.  This cup was slightly more open than the cup on the left side.  Adequate bony resection was confirmed and good placement of the final reamer was confirmed.  The cup was then placed and was then tapped into position with excellent press- fit obtained.  Following this, a +4 mm  liner was placed.  Attention was then redirected towards the femur.  The femoral lift was placed.  The leg was then taken into external rotation and then adduction and extension.  Conjoined tendon was released after trochanteric retractor was placed.  Mueller retractor also placed around the femoral calcar. The patient's bone was very hard.  Lateralization was performed with a box cutter and rongeur.  Broaching was then performed up to a size 12, going parallel to the posterior cortex of the femur.  With this being done, 12 broach of the femoral calcar was co-plane.  A size 12 gave a very good fit into the canal.  With the trial stem in  position, multiple ball sizes were utilized, and an 8.5 mm ball size gave good stability with 45 degrees of external rotation and 60 degrees of extension.  This definitely improved stability over the +2.5 ball size.  With the 8.5 mm ball size and good stability obtained, the leg lengths were checked and were approximately equal.  At this time, the trial components were removed and the true components were placed with the same stability parameters maintained.  Thorough irrigation then performed.  Exparel, Marcaine, saline injection then performed to the soft tissue and capsule.  Marcaine, morphine, clonidine injected into the skin.  Capsule was closed using #1 Vicryl suture followed by interrupted inverted 0 Vicryl suture, 2-0 Vicryl suture, and a 3-0 Monocryl.  At this time, Aquacel dressing placed.  The patient tolerated the procedure well without any immediate complications.  Transferred to the recovery room in stable condition.     Burnard BuntingG. Scott Dean, M.D.     GSD/MEDQ  D:  12/09/2017  T:  12/10/2017  Job:  [id number]

## 2017-12-10 NOTE — Addendum Note (Signed)
Addendum  created 12/10/17 0824 by Epifanio LeschesMercer, Tyann Niehaus L, CRNA   Intraprocedure Event edited

## 2017-12-11 MED ORDER — ZOLPIDEM TARTRATE 5 MG PO TABS
5.0000 mg | ORAL_TABLET | Freq: Every evening | ORAL | Status: DC | PRN
  Administered 2017-12-11: 5 mg via ORAL
  Filled 2017-12-11: qty 1

## 2017-12-11 MED ORDER — OXYCODONE HCL ER 10 MG PO T12A
10.0000 mg | EXTENDED_RELEASE_TABLET | Freq: Two times a day (BID) | ORAL | Status: DC
  Administered 2017-12-11 – 2017-12-12 (×3): 10 mg via ORAL
  Filled 2017-12-11 (×3): qty 1

## 2017-12-11 NOTE — Progress Notes (Signed)
Physical Therapy Treatment Patient Details Name: Chad LevineMichael I Wagner MRN: [id number] DOB: 04/27/1966 Today's Date: 12/11/2017    History of Present Illness Pt. is a 52 y.o. M with previous L THA and R shoulder arthroscopy admitted s/p right THA via direct anterior approach.    PT Comments    Patient is progressing very well towards their physical therapy goals. Patient reports 7/10 L hip pain, but requested to get up and walk down the hallway before receiving pain medication. [facility], patient demonstrated improved balance and consistent step through pattern. Patient and patient wife educated on a caregiver providing supervision to patient when climbing steps at home for safety.     Follow Up Recommendations  Follow surgeon's recommendation for DC plan and follow-up therapies     Equipment Recommendations  None recommended by PT    Recommendations for Other Services       Precautions / Restrictions Precautions Precautions: Fall Restrictions Weight Bearing Restrictions: Yes RLE Weight Bearing: Weight bearing as tolerated    Mobility  Bed Mobility Overal bed mobility: Modified Independent             General bed mobility comments: Pt OOB in recliner  Transfers Overall transfer level: Needs assistance Equipment used: Straight cane;Rolling walker (2 wheeled) Transfers: Sit to/from Stand Sit to Stand: Modified independent (Device/Increase time)            Ambulation/Gait Ambulation/Gait assistance: Min guard Ambulation Distance (Feet): 100 Feet Assistive device: Straight cane;Rolling walker (2 wheeled) Gait Pattern/deviations: Step-through pattern;Decreased step length - left;Step-to pattern   Gait velocity interpretation: Below normal speed for age/gender General Gait Details: Supervision required for ambulation with RW x 100 feet and min guard provided for ambulation with SPC x 100 feet. Patient with improved balance this session with SPC and  able to utilize step through pattern consistently.     Stairs     Stair Management: One rail Right;One rail Left;Step to pattern;Sideways;Forwards;With cane Number of Stairs: 4 General stair comments: Patient requiring min guard for stairs for safety. Performed sideways with both hands on R railing initially and then practiced with SPC in left hand and HHA in right hand. Pt did not place any significant weight through R hand to assist.   Wheelchair Mobility    Modified Rankin (Stroke Patients Only)       Balance Overall balance assessment: Needs assistance Sitting-balance support: No upper extremity supported;Feet supported Sitting balance-Leahy Scale: Normal     Standing balance support: No upper extremity supported Standing balance-Leahy Scale: Good                              Cognition Arousal/Alertness: Awake/alert Behavior During Therapy: WFL for tasks assessed/performed Overall Cognitive Status: Within Functional Limits for tasks assessed                                        Exercises      General Comments General comments (skin integrity, edema, etc.): Patient wife and patient educated on supervision with stairs at home and use of AD.       Pertinent Vitals/Pain Pain Assessment: 0-10 Pain Score: 7  Pain Location: R hip Pain Descriptors / Indicators: Operative site guarding Pain Intervention(s): Patient requesting pain meds-RN notified;Ice applied;Monitored during session;Limited activity within patient's tolerance    Home Living  Prior Function            PT Goals (current goals can now be found in the care plan section) Acute Rehab PT Goals Patient Stated Goal: Climb 6 steps so he can enter his home PT Goal Formulation: With patient Time For Goal Achievement: 12/15/17 Potential to Achieve Goals: Good Progress towards PT goals: Progressing toward goals    Frequency    BID      PT  Plan Current plan remains appropriate    Co-evaluation              AM-PAC PT "6 Clicks" Daily Activity  Outcome Measure  Difficulty turning over in bed (including adjusting bedclothes, sheets and blankets)?: A Little Difficulty moving from lying on back to sitting on the side of the bed? : A Little Difficulty sitting down on and standing up from a chair with arms (e.g., wheelchair, bedside commode, etc,.)?: A Little Help needed moving to and from a bed to chair (including a wheelchair)?: A Little Help needed walking in hospital room?: A Little Help needed climbing 3-5 steps with a railing? : A Little 6 Click Score: 18    End of Session Equipment Utilized During Treatment: Gait belt Activity Tolerance: Patient tolerated treatment well Patient left: with call bell/phone within reach;with family/visitor present;in bed   PT Visit Diagnosis: Muscle weakness (generalized) (M62.81);Difficulty in walking, not elsewhere classified (R26.2)     Time: 1610-9604 PT Time Calculation (min) (ACUTE ONLY): 11 min  Charges:  $Gait Training: 8-22 mins                    G Codes:       Laurina Bustle, PT, DPT Acute Rehabilitation Services     Vanetta Mulders 12/11/2017, 1:36 PM

## 2017-12-11 NOTE — Progress Notes (Signed)
Subjective: Pt stable - pain ok - doing pt   Objective: Vital signs in last 24 hours: Temp:  [98.2 F (36.8 C)-98.3 F (36.8 C)] 98.2 F (36.8 C) (03/07 0545) Pulse Rate:  [76-85] 85 (03/07 0545) BP: (104-131)/(55-72) 131/72 (03/07 0545) SpO2:  [96 %-98 %] 96 % (03/07 0545)  Intake/Output from previous day: 03/06 0701 - 03/07 0700 In: 960 [P.O.:960] Out: 2050 [Urine:2050] Intake/Output this shift: No intake/output data recorded.  Exam:  Intact pulses distally  Labs: No results for input(s): HGB in the last 72 hours. No results for input(s): WBC, RBC, HCT, PLT in the last 72 hours. No results for input(s): NA, K, CL, CO2, BUN, CREATININE, GLUCOSE, CALCIUM in the last 72 hours. No results for input(s): LABPT, INR in the last 72 hours.  Assessment/Plan: Plan dc am - add Berna Bueambien   G Scott Dean 12/11/2017, 8:22 AM

## 2017-12-11 NOTE — Progress Notes (Signed)
Physical Therapy Treatment Patient Details Name: Chad Wagner MRN: [id number] DOB: November 15, 1965 Today's Date: 12/11/2017    History of Present Illness Pt. is a 52 y.o. M with previous L THA and R shoulder arthroscopy admitted s/p right THA via direct anterior approach.    PT Comments    Patient is progressing very well towards their physical therapy goals. Patient trialed [facility] today during transfers and ambulation with no overt LOB. Able to progress to step through pattern and increased gait speed towards end. Recommended continued use of RW in room for safety but patient will benefit from further gait training with SPC. Patient also climbed 9 steps with min guard and alternating between practicing with railings versus rail and SPC.     Follow Up Recommendations  Follow surgeon's recommendation for DC plan and follow-up therapies     Equipment Recommendations  None recommended by PT    Recommendations for Other Services       Precautions / Restrictions Precautions Precautions: Fall Restrictions Weight Bearing Restrictions: Yes RLE Weight Bearing: Weight bearing as tolerated    Mobility  Bed Mobility Overal bed mobility: Modified Independent             General bed mobility comments: Patient performed supine to sit modified independent with HOB flat and increased time.   Transfers Overall transfer level: Needs assistance Equipment used: Straight cane Transfers: Sit to/from Stand Sit to Stand: Supervision            Ambulation/Gait Ambulation/Gait assistance: Min guard Ambulation Distance (Feet): 220 Feet Assistive device: Straight cane Gait Pattern/deviations: Step-through pattern;Decreased step length - left;Step-to pattern   Gait velocity interpretation: Below normal speed for age/gender General Gait Details: Patient trialed [facility] today with min guard provided. Initially ambulating with step to pattern but able to progress to step through pattern. VC's  provided for decreased R step length and increased L step length to promote symmetry.    Stairs Stairs: Yes   Stair Management: One rail Right;One rail Left;Step to pattern;Sideways;Forwards;With cane Number of Stairs: 9 General stair comments: Patient requiring min guard for stairs for safety. On first trial, patient climbed steps sideways using both hands on R railing. On 2nd and 3rd trial, patient used L railing and SPC in R hand to simulate home environment.    Wheelchair Mobility    Modified Rankin (Stroke Patients Only)       Balance Overall balance assessment: Needs assistance Sitting-balance support: No upper extremity supported;Feet supported Sitting balance-Leahy Scale: Normal     Standing balance support: No upper extremity supported Standing balance-Leahy Scale: Good                              Cognition Arousal/Alertness: Awake/alert Behavior During Therapy: WFL for tasks assessed/performed Overall Cognitive Status: Within Functional Limits for tasks assessed                                        Exercises Total Joint Exercises Hip ABduction/ADduction: Right;Standing;15 reps Other Exercises Other Exercises: Standing calf raises with hand support x 20    General Comments        Pertinent Vitals/Pain Faces Pain Scale: Hurts a little bit Pain Location: R hip Pain Descriptors / Indicators: Operative site guarding Pain Intervention(s): Premedicated before session    Home Living  Prior Function            PT Goals (current goals can now be found in the care plan section) Acute Rehab PT Goals Patient Stated Goal: Climb 6 steps so he can enter his home PT Goal Formulation: With patient Time For Goal Achievement: 12/15/17 Potential to Achieve Goals: Good Progress towards PT goals: Progressing toward goals    Frequency    BID      PT Plan Current plan remains appropriate     Co-evaluation              AM-PAC PT "6 Clicks" Daily Activity  Outcome Measure  Difficulty turning over in bed (including adjusting bedclothes, sheets and blankets)?: A Little Difficulty moving from lying on back to sitting on the side of the bed? : A Little Difficulty sitting down on and standing up from a chair with arms (e.g., wheelchair, bedside commode, etc,.)?: A Little Help needed moving to and from a bed to chair (including a wheelchair)?: A Little Help needed walking in hospital room?: A Little Help needed climbing 3-5 steps with a railing? : A Little 6 Click Score: 18    End of Session Equipment Utilized During Treatment: Gait belt Activity Tolerance: Patient tolerated treatment well Patient left: with call bell/phone within reach;with family/visitor present;in bed   PT Visit Diagnosis: Muscle weakness (generalized) (M62.81);Difficulty in walking, not elsewhere classified (R26.2)     Time: [id number] PT Time Calculation (min) (ACUTE ONLY): 28 min  Charges:  $Gait Training: 8-22 mins $Therapeutic Activity: 8-22 mins                    G Codes:       Laurina Bustlearoline Antwaine Boomhower, PT, DPT Acute Rehabilitation Services     Vanetta MuldersCarloine H Malikye Reppond 12/11/2017, 9:41 AM

## 2017-12-12 MED ORDER — ASPIRIN EC 81 MG PO TBEC: 81.0000 mg | DELAYED_RELEASE_TABLET | Freq: Two times a day (BID) | ORAL | 0 refills | Status: AC

## 2017-12-12 MED FILL — oxyCODONE HCL ER 10 MG T12A: 10 | 7 days supply | Qty: 7 | Fill #0

## 2017-12-12 MED FILL — oxyCODONE HCL 5 MG TABS: 5 | 7 days supply | Qty: 42 | Fill #0

## 2017-12-12 MED FILL — ZOLPIDEM TARTRATE 5 MG TAB: 5 | 21 days supply | Qty: 21 | Fill #0

## 2017-12-12 MED FILL — METHOCARBAMOL 500 MG TABS: 500 | 10 days supply | Qty: 30 | Fill #0

## 2017-12-12 NOTE — Progress Notes (Signed)
Subjective: Patient stable.  Pain controlled.  Doing well in therapy   Objective: Vital signs in last 24 hours: Temp:  [98.8 F (37.1 C)] 98.8 F (37.1 C) (03/07 2047) Pulse Rate:  [92] 92 (03/07 2047) BP: (135)/(73) 135/73 (03/07 2047) SpO2:  [97 %] 97 % (03/07 2047)  Intake/Output from previous day: 03/07 0701 - 03/08 0700 In: 960 [P.O.:960] Out: 1500 [Urine:1500] Intake/Output this shift: No intake/output data recorded.  Exam:  Intact pulses distally Dorsiflexion/Plantar flexion intact  Labs: No results for input(s): HGB in the last 72 hours. No results for input(s): WBC, RBC, HCT, PLT in the last 72 hours. No results for input(s): NA, K, CL, CO2, BUN, CREATININE, GLUCOSE, CALCIUM in the last 72 hours. No results for input(s): LABPT, INR in the last 72 hours.  Assessment/Plan: Plan is to discharge to home today.  Follow-up in 1 week.   Marrianne MoodG Scott Dean 12/12/2017, 8:26 AM

## 2017-12-12 NOTE — Progress Notes (Signed)
Physical Therapy Treatment Patient Details Name: Chad Wagner MRN: [id number] DOB: July 01, 1966 Today's Date: 12/12/2017    History of Present Illness Pt. is a 52 y.o. M with previous L THA and R shoulder arthroscopy admitted s/p right THA via direct anterior approach.    PT Comments    Patient is progressing very well towards their physical therapy goals. Patient shows improved independence and balance with ambulating with the [facility] and climbing steps. Demonstrates increased gait speed, consistent step through pattern and equal step lengths. Patient educated on activity recommendations and supervision for outdoor walking; he verbalized understanding.    Follow Up Recommendations  Follow surgeon's recommendation for DC plan and follow-up therapies     Equipment Recommendations  None recommended by PT    Recommendations for Other Services       Precautions / Restrictions Precautions Precautions: Fall Restrictions Weight Bearing Restrictions: Yes RLE Weight Bearing: Weight bearing as tolerated    Mobility  Bed Mobility               General bed mobility comments: Pt OOB in recliner  Transfers Overall transfer level: Modified independent Equipment used: Straight cane;Rolling walker (2 wheeled) Transfers: Sit to/from Stand Sit to Stand: Modified independent (Device/Increase time)            Ambulation/Gait Ambulation/Gait assistance: Supervision Ambulation Distance (Feet): 300 Feet Assistive device: Straight cane;Rolling walker (2 wheeled) Gait Pattern/deviations: Step-through pattern   Gait velocity interpretation: Below normal speed for age/gender General Gait Details: Supervision required for ambulation with RW x 100 feet and ambulation with SPC x 200 feet. VC's provided for hip extension to promote upright posture.     Stairs     Stair Management: One rail Right;One rail Left;Step to pattern;Sideways;Forwards;With cane Number of Stairs: 8 General stair  comments: Patient requiring supervision for stairs for safety. Initially tried to "grapevine" up steps, crossing one foot over another and PT instructed patient that this is not a safe technique and should not be attempted; patient verbalized understanding. Climbed steps utilizing both hands on one railing going forwards and then trialed steps with SPC and no rail. Patient with no overt LOB.   Wheelchair Mobility    Modified Rankin (Stroke Patients Only)       Balance Overall balance assessment: Needs assistance Sitting-balance support: No upper extremity supported;Feet supported Sitting balance-Leahy Scale: Normal     Standing balance support: No upper extremity supported Standing balance-Leahy Scale: Good                              Cognition Arousal/Alertness: Awake/alert Behavior During Therapy: WFL for tasks assessed/performed Overall Cognitive Status: Within Functional Limits for tasks assessed                                        Exercises      General Comments General comments (skin integrity, edema, etc.): Patient instructed on car transfer technique.      Pertinent Vitals/Pain Faces Pain Scale: Hurts a little bit Pain Location: R hip Pain Descriptors / Indicators: Operative site guarding;Other (Comment)(Stiff) Pain Intervention(s): Monitored during session    Home Living                      Prior Function            PT Goals (  current goals can now be found in the care plan section) Acute Rehab PT Goals Patient Stated Goal: Climb 6 steps so he can enter his home PT Goal Formulation: With patient Time For Goal Achievement: 12/15/17 Potential to Achieve Goals: Good Progress towards PT goals: Progressing toward goals    Frequency    BID      PT Plan Current plan remains appropriate    Co-evaluation              AM-PAC PT "6 Clicks" Daily Activity  Outcome Measure  Difficulty turning over in bed  (including adjusting bedclothes, sheets and blankets)?: None Difficulty moving from lying on back to sitting on the side of the bed? : None Difficulty sitting down on and standing up from a chair with arms (e.g., wheelchair, bedside commode, etc,.)?: None Help needed moving to and from a bed to chair (including a wheelchair)?: A Little Help needed walking in hospital room?: A Little Help needed climbing 3-5 steps with a railing? : A Little 6 Click Score: 21    End of Session Equipment Utilized During Treatment: Gait belt Activity Tolerance: Patient tolerated treatment well Patient left: with call bell/phone within reach;with family/visitor present;in bed   PT Visit Diagnosis: Muscle weakness (generalized) (M62.81);Difficulty in walking, not elsewhere classified (R26.2)     Time: [id number] PT Time Calculation (min) (ACUTE ONLY): 19 min  Charges:  $Gait Training: 8-22 mins                    G Codes:       Laurina Bustlearoline Laretta Pyatt, PT, DPT Acute Rehabilitation Services     Vanetta MuldersCarloine H Catlin Doria 12/12/2017, 9:42 AM

## 2017-12-12 NOTE — Progress Notes (Signed)
Pt given prescriptions and discharge instructions. Instructions gone over with him and answered all questions to satisfaction. All belongings gathered to be sent home. Wife is taking him home.

## 2017-12-15 ENCOUNTER — Telehealth (INDEPENDENT_AMBULATORY_CARE_PROVIDER_SITE_OTHER): Payer: Self-pay | Admitting: Orthopedic Surgery

## 2017-12-15 NOTE — Telephone Encounter (Signed)
Tried calling back. No answer. Mailbox full, unable to LM.

## 2017-12-15 NOTE — Telephone Encounter (Signed)
Amanda-PT with [facility] called asked if Dr August Saucerean will sign off on verbal orders for (PT) 1 WK 1 and 2 WK 2. Also, Marchelle Folksmanda needed clarification if any precautions and if any what are they. The number to contact Marchelle Folksmanda is  [phone]

## 2017-12-15 NOTE — Telephone Encounter (Signed)
Please advise if any precautions for patient. Thanks.

## 2017-12-15 NOTE — Telephone Encounter (Signed)
Avoid er and extension of right leg and hip respectively

## 2017-12-16 NOTE — Telephone Encounter (Signed)
Marchelle Folksmanda called back I verified the info on the encounter. Amanda's VM box is now clear if you need to call her back. Called to verify if verbal orders were complete as well as pt precautions.

## 2017-12-16 NOTE — Telephone Encounter (Signed)
IC back. No answer. LMVM with all details.

## 2017-12-24 ENCOUNTER — Telehealth (INDEPENDENT_AMBULATORY_CARE_PROVIDER_SITE_OTHER): Payer: Self-pay

## 2017-12-24 ENCOUNTER — Ambulatory Visit (INDEPENDENT_AMBULATORY_CARE_PROVIDER_SITE_OTHER): Payer: Non-veteran care | Admitting: Orthopedic Surgery

## 2017-12-24 ENCOUNTER — Ambulatory Visit (INDEPENDENT_AMBULATORY_CARE_PROVIDER_SITE_OTHER): Payer: Non-veteran care

## 2017-12-24 ENCOUNTER — Encounter (INDEPENDENT_AMBULATORY_CARE_PROVIDER_SITE_OTHER): Payer: Self-pay | Admitting: Orthopedic Surgery

## 2017-12-24 DIAGNOSIS — Z96641 Presence of right artificial hip joint: Secondary | ICD-10-CM

## 2017-12-24 NOTE — Discharge Summary (Signed)
Physician Discharge Summary  Patient ID: Erline LevineMichael I Bellot MRN: [id number] DOB/AGE: 52/02/1966 52 y.o.  Admit date: 12/09/2017 Discharge date: 12/12/2017  Admission Diagnoses:  Active Problems:   Hip arthritis   Discharge Diagnoses:  Same  Surgeries: Procedure(s): RIGHT TOTAL HIP ARTHROPLASTY ANTERIOR APPROACH on 12/09/2017   Consultants:   Discharged Condition: Stable  Hospital Course: Erline LevineMichael I Greer is an 52 y.o. male who was admitted 12/09/2017 with a chief complaint of right hip pain, and found to have a diagnosis of right hip arthritis.  They were brought to the operating room on 12/09/2017 and underwent the above named procedures.  Patient was seen by physical therapy and was mobilized weightbearing as tolerated on postop day #1.  Patient was slow to mobilize but was walking in the halls by postop day #2 and his pain was controlled by postop day #3.  Discharge to home in good condition with home health physical therapy for gait training and strengthening.  I will see him back in 2 weeks.  Home on pain medicine as well as DVT prophylaxis medicine.  Antibiotics given:  Anti-infectives (From admission, onward)   Start     Dose/Rate Route Frequency Ordered Stop   12/09/17 1930  vancomycin (VANCOCIN) IVPB 1000 mg/200 mL premix     1,000 mg 200 mL/hr over 60 Minutes Intravenous Every 12 hours 12/09/17 1805 12/10/17 0158   12/09/17 1030  clindamycin (CLEOCIN) IVPB 900 mg     900 mg 100 mL/hr over 30 Minutes Intravenous To [facility] 12/08/17 1148 12/09/17 1300    .  Recent vital signs:  Vitals:   12/11/17 0545 12/11/17 2047  BP: 131/72 135/73  Pulse: 85 92  Resp:    Temp: 98.2 F (36.8 C) 98.8 F (37.1 C)  SpO2: 96% 97%    Recent laboratory studies:  Results for orders placed or performed during the hospital encounter of 11/27/17  Surgical pcr screen  Result Value Ref Range   MRSA, PCR NEGATIVE NEGATIVE   Staphylococcus aureus POSITIVE (A) NEGATIVE  Urine culture   Result Value Ref Range   Specimen Description URINE, CLEAN CATCH    Special Requests NONE    Culture      NO GROWTH Performed at [facility], [address] [place], [place] [address]    Report Status 11/28/2017 FINAL   Basic metabolic panel  Result Value Ref Range   Sodium 138 135 - 145 mmol/L   Potassium 4.0 3.5 - 5.1 mmol/L   Chloride 103 101 - 111 mmol/L   CO2 25 22 - 32 mmol/L   Glucose, Bld 140 (H) 65 - 99 mg/dL   BUN 13 6 - 20 mg/dL   Creatinine, Ser 4.690.80 0.61 - 1.24 mg/dL   Calcium 9.2 8.9 - 62.910.3 mg/dL   GFR calc non Af Amer >60 >60 mL/min   GFR calc Af Amer >60 >60 mL/min   Anion gap 10 5 - 15  CBC  Result Value Ref Range   WBC 5.5 4.0 - 10.5 K/uL   RBC 5.45 4.22 - 5.81 MIL/uL   Hemoglobin 16.8 13.0 - 17.0 g/dL   HCT 52.849.3 41.339.0 - 24.452.0 %   MCV 90.5 78.0 - 100.0 fL   MCH 30.8 26.0 - 34.0 pg   MCHC 34.1 30.0 - 36.0 g/dL   RDW 01.013.3 27.211.5 - 53.615.5 %   Platelets 164 150 - 400 K/uL  Urinalysis, Routine w reflex microscopic  Result Value Ref Range   Color, Urine AMBER (  A) YELLOW   APPearance HAZY (A) CLEAR   Specific Gravity, Urine 1.024 1.005 - 1.030   pH 5.0 5.0 - 8.0   Glucose, UA NEGATIVE NEGATIVE mg/dL   Hgb urine dipstick NEGATIVE NEGATIVE   Bilirubin Urine NEGATIVE NEGATIVE   Ketones, ur NEGATIVE NEGATIVE mg/dL   Protein, ur NEGATIVE NEGATIVE mg/dL   Nitrite NEGATIVE NEGATIVE   Leukocytes, UA NEGATIVE NEGATIVE    Discharge Medications:   Allergies as of 12/12/2017      Reactions   Penicillins Anaphylaxis, Swelling   Noted 01/2013 - swollen gland in tongue  PATIENT HAS HAD A PCN REACTION WITH IMMEDIATE RASH, FACIAL/TONGUE/THROAT SWELLING, SOB, OR LIGHTHEADEDNESS WITH HYPOTENSION:  #  #  #  YES  #  #  #   Has patient had a PCN reaction causing severe rash involving mucus membranes or skin necrosis: No Has patient had a PCN reaction that required hospitalization No Has patient had a PCN reaction occurring within the last 10 years: No      Medication  List    TAKE these medications   acetaminophen 500 MG tablet Commonly known as:  TYLENOL Take 1,000 mg by mouth 2 (two) times daily as needed for mild pain.   aspirin EC 81 MG tablet Take 1 tablet (81 mg total) by mouth 2 (two) times daily. What changed:  when to take this   buPROPion 300 MG 24 hr tablet Commonly known as:  WELLBUTRIN XL Take 300 mg by mouth every morning.   busPIRone 10 MG tablet Commonly known as:  BUSPAR Take 10 mg by mouth 2 (two) times daily.   cetirizine 10 MG tablet Commonly known as:  ZYRTEC Take 10 mg by mouth daily.   lurasidone 40 MG Tabs tablet Commonly known as:  LATUDA Take 40 mg by mouth at bedtime.   multivitamin with minerals tablet Take 1 tablet by mouth daily.   VITAMIN D3 SUPER STRENGTH 2000 units Tabs Generic drug:  Cholecalciferol Take 2,000 Units by mouth daily.       Diagnostic Studies: Dg Chest 2 View  Result Date: 11/27/2017 CLINICAL DATA:  Preop hip replacement EXAM: CHEST  2 VIEW COMPARISON:  02/16/2012 FINDINGS: Heart and mediastinal contours are within normal limits. No focal opacities or effusions. No acute bony abnormality. IMPRESSION: No active cardiopulmonary disease. Electronically Signed   By: Charlett Nose M.D.   On: 11/27/2017 09:54   Dg C-arm 1-60 Min  Result Date: 12/09/2017 CLINICAL DATA:  RIGHT hip total arthroplasty. EXAM: DG C-ARM 61-120 MIN; OPERATIVE RIGHT HIP WITH PELVIS FLUOROSCOPY TIME:  85 seconds. COMPARISON:  Pelvic radiograph October 01, 2017 FINDINGS: Four intraoperative fluoroscopic spot views documenting RIGHT hip total arthroplasty. Interpreting radiologist was not present at time of procedure. Surgical clips project in the scrotum. IMPRESSION: Intraoperative imaging of RIGHT hip total arthroplasty. Electronically Signed   By: Awilda Metro M.D.   On: 12/09/2017 17:23   Dg C-arm 1-60 Min  Result Date: 12/09/2017 CLINICAL DATA:  RIGHT hip total arthroplasty. EXAM: DG C-ARM 61-120 MIN; OPERATIVE  RIGHT HIP WITH PELVIS FLUOROSCOPY TIME:  85 seconds. COMPARISON:  Pelvic radiograph October 01, 2017 FINDINGS: Four intraoperative fluoroscopic spot views documenting RIGHT hip total arthroplasty. Interpreting radiologist was not present at time of procedure. Surgical clips project in the scrotum. IMPRESSION: Intraoperative imaging of RIGHT hip total arthroplasty. Electronically Signed   By: Awilda Metro M.D.   On: 12/09/2017 17:23   Dg C-arm 1-60 Min  Result Date: 12/09/2017 CLINICAL DATA:  RIGHT hip total arthroplasty. EXAM: DG C-ARM 61-120 MIN; OPERATIVE RIGHT HIP WITH PELVIS FLUOROSCOPY TIME:  85 seconds. COMPARISON:  Pelvic radiograph October 01, 2017 FINDINGS: Four intraoperative fluoroscopic spot views documenting RIGHT hip total arthroplasty. Interpreting radiologist was not present at time of procedure. Surgical clips project in the scrotum. IMPRESSION: Intraoperative imaging of RIGHT hip total arthroplasty. Electronically Signed   By: Awilda Metro M.D.   On: 12/09/2017 17:23   Dg Hip Port Unilat With Pelvis 1v Right  Result Date: 12/09/2017 CLINICAL DATA:  Hip arthritis EXAM: DG HIP (WITH OR WITHOUT PELVIS) 1V PORT RIGHT COMPARISON:  Radiograph 10/01/2017 FINDINGS: Previous left hip replacement with normal alignment. Pubic symphysis and rami are intact. Interval right hip replacement with normal alignment. Small foci of gas in the soft tissues. Clips in the scrotal area. IMPRESSION: Status post right hip replacement with expected postsurgical changes. Previous left hip replacement with normal alignment. Electronically Signed   By: Jasmine Pang M.D.   On: 12/09/2017 20:11   Dg Hip Operative Unilat With Pelvis Right  Result Date: 12/09/2017 CLINICAL DATA:  RIGHT hip total arthroplasty. EXAM: DG C-ARM 61-120 MIN; OPERATIVE RIGHT HIP WITH PELVIS FLUOROSCOPY TIME:  85 seconds. COMPARISON:  Pelvic radiograph October 01, 2017 FINDINGS: Four intraoperative fluoroscopic spot views documenting  RIGHT hip total arthroplasty. Interpreting radiologist was not present at time of procedure. Surgical clips project in the scrotum. IMPRESSION: Intraoperative imaging of RIGHT hip total arthroplasty. Electronically Signed   By: Awilda Metro M.D.   On: 12/09/2017 17:23    Disposition:   Discharge Instructions    Call MD / Call 911   Complete by:  As directed    If you experience chest pain or shortness of breath, CALL 911 and be transported to the hospital emergency room.  If you develope a fever above 101 F, pus (white drainage) or increased drainage or redness at the wound, or calf pain, call your surgeon's office.   Constipation Prevention   Complete by:  As directed    Drink plenty of fluids.  Prune juice may be helpful.  You may use a stool softener, such as Colace (over the counter) 100 mg twice a day.  Use MiraLax (over the counter) for constipation as needed.   Diet - low sodium heart healthy   Complete by:  As directed    Discharge instructions   Complete by:  As directed    Okay to remove dressing 1 week from tomorrow Weightbearing as tolerated using crutches or walker Follow-up in 10 days   Increase activity slowly as tolerated   Complete by:  As directed          Signed: Burnard Bunting 12/24/2017, 8:55 AM

## 2017-12-24 NOTE — Telephone Encounter (Signed)
Patient called stating that his right knee had just gave out when arriving home.  Patient was seen by Dr. August Saucerean today.  Advised Dr. August Saucerean of what was going on with patient and he advised patient to ice the right knee and use his crutches for the remainder of the day and check to see if he has any swelling overnight.  Patient will call tomorrow if he has any concerns about his right knee.

## 2017-12-26 NOTE — Telephone Encounter (Signed)
fyi

## 2017-12-26 NOTE — Telephone Encounter (Signed)
FYI Patient called to update Dr. August Saucerean that the swelling has gone down in his right knee and is no longer hurting.

## 2017-12-27 ENCOUNTER — Encounter (INDEPENDENT_AMBULATORY_CARE_PROVIDER_SITE_OTHER): Payer: Self-pay | Admitting: Orthopedic Surgery

## 2017-12-27 NOTE — Progress Notes (Signed)
   Post-Op Visit Note   Patient: Chad LevineMichael I Hilbun           Date of Birth: 09/03/1966           MRN: [id number] Visit Date: 12/24/2017 PCP: Floyde Parkinsyer, Christopher, MD   Assessment & Plan:  Chief Complaint:  Chief Complaint  Patient presents with  . Right Hip - Follow-up, Routine Post Op   Visit Diagnoses:  1. Status post total replacement of right hip     Plan: Casimiro NeedleMichael is a patient who is now several weeks out right total hip replacement.  Is been doing reasonably well.  Last visit with home health PT yesterday.  Starting outpatient physical therapy.  He is using a cane.  On examination he has a pretty good looking gait.  Do[place] physical [place] in [place].  Leg lengths approximately equal.  Incision intact.  Plan at this time is to continue with outpatient PT for gait training.  I will see him back in 6 weeks for final clinical check.  Radiographs look reasonable today.  Follow-Up Instructions: Return in about 6 weeks (around 02/04/2018).   Orders:  Orders Placed This Encounter  Procedures  . XR HIP UNILAT W OR W/O PELVIS 2-3 VIEWS RIGHT   No orders of the defined types were placed in this encounter.   Imaging: No results found.  PMFS History: Patient Active Problem List   Diagnosis Date Noted  . Hip arthritis 01/30/2016   Past Medical History:  Diagnosis Date  . Anger   . Anxiety   . Arthritis   . Asthma    MILD  DUE TO ALLERGIES   (RESOLVED)  . Bipolar disorder (HCC)   . Depression   . Varicose vein of leg     History reviewed. No pertinent family history.  Past Surgical History:  Procedure Laterality Date  . APPENDECTOMY     1985  . SHOULDER ARTHROSCOPY WITH OPEN ROTATOR CUFF REPAIR AND DISTAL CLAVICLE ACROMINECTOMY Right 11/02/2015   Procedure: SHOULDER DIAGNOSTIC OPERATIVE ARTHROSCOPY WITH MINI-OPEN ROTATOR CUFF REPAIR, DISTAL CLAVICLE EXCISION,  BICEPS TENODESIS.  ;  Surgeon: Cammy CopaScott Gregory Dean, MD;  Location: [facility] OR;  Service: Orthopedics;  Laterality: Right;    . TONSILLECTOMY     T+A 1980  . TOTAL HIP ARTHROPLASTY Left 01/30/2016   Procedure: LEFT TOTAL HIP ARTHROPLASTY ANTERIOR APPROACH;  Surgeon: Cammy CopaScott Gregory Dean, MD;  Location: [facility] OR;  Service: Orthopedics;  Laterality: Left;  . TOTAL HIP ARTHROPLASTY Right 12/09/2017   Procedure: RIGHT TOTAL HIP ARTHROPLASTY ANTERIOR APPROACH;  Surgeon: Cammy Copaean, Gregory Scott, MD;  [facility];  Service: Orthopedics;  Laterality: Right;  Marland Kitchen. VARICOSE VEIN SURGERY     1993  . VASECTOMY     2010   Social History   Occupational History  . Not on file  Tobacco Use  . Smoking status: Former Games developermoker  . Smokeless tobacco: Never Used  . Tobacco comment: quit over 20+ years ago and only occasionally smoked then  Substance and Sexual Activity  . Alcohol use: Yes    Alcohol/week: 0.0 oz    Types: 4 - 5 Glasses of wine per week  . Drug use: No  . Sexual activity: Not on file

## 2017-12-29 NOTE — Progress Notes (Signed)
Faxed

## 2017-12-31 ENCOUNTER — Telehealth (INDEPENDENT_AMBULATORY_CARE_PROVIDER_SITE_OTHER): Payer: Self-pay | Admitting: Orthopedic Surgery

## 2017-12-31 MED ORDER — ZOLPIDEM TARTRATE 5 MG PO TABS: 5.0000 mg | ORAL_TABLET | Freq: Every evening | ORAL | 0 refills | Status: AC | PRN

## 2017-12-31 NOTE — Telephone Encounter (Signed)
Ok to rf? 

## 2017-12-31 NOTE — Telephone Encounter (Signed)
Called to pharmacy. Patient advised done.  

## 2017-12-31 NOTE — Telephone Encounter (Signed)
y

## 2017-12-31 NOTE — Telephone Encounter (Signed)
Patient requesting refill on ambien, he said it was too complicated to get [facility]. He wants it sent to [facility] in [place]. Please call patient once called in # [phone]

## 2018-02-02 ENCOUNTER — Ambulatory Visit (INDEPENDENT_AMBULATORY_CARE_PROVIDER_SITE_OTHER): Payer: Non-veteran care | Admitting: Orthopedic Surgery

## 2018-02-02 ENCOUNTER — Encounter (INDEPENDENT_AMBULATORY_CARE_PROVIDER_SITE_OTHER): Payer: Self-pay | Admitting: Orthopedic Surgery

## 2018-02-02 DIAGNOSIS — Z96641 Presence of right artificial hip joint: Secondary | ICD-10-CM

## 2018-02-02 NOTE — Progress Notes (Signed)
   Post-Op Visit Note   Patient: Chad Wagner           Date of Birth: [birth year]           MRN: [id number] Visit Date: 02/02/2018 PCP: Floyde Parkins, MD   Assessment & Plan:  Chief Complaint:  Chief Complaint  Patient presents with  . Right Hip - Follow-up   Visit Diagnoses:  1. Status post total replacement of right hip     Plan: Chad Wagner is a 52 year old patient who is now about 6 weeks out right total hip replacement.  Has been doing well.  Not taking any medication.  Going to therapy twice a week.  Therapy note is reviewed and he is doing range of motion and strengthening exercises.  He is no longer taking Ambien for sleep at night.  Takes occasional Tylenol and does not use a cane.  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  Impression is Chad Wagner has good strength in his right leg.  Leg lengths look equal.  I like to see him back in the end of July in 3 months just for clinical recheck and release.  Follow-Up Instructions: Return in about 3 months (around 05/04/2018).   Orders:  No orders of the defined types were placed in this encounter.  No orders of the defined types were placed in this encounter.   Imaging: No results found.  PMFS History: Patient Active Problem List   Diagnosis Date Noted  . Hip arthritis 01/30/2016   Past Medical History:  Diagnosis Date  . Anger   . Anxiety   . Arthritis   . Asthma    MILD  DUE TO ALLERGIES   (RESOLVED)  . Bipolar disorder (HCC)   . Depression   . Varicose vein of leg     History reviewed. No pertinent family history.  Past Surgical History:  Procedure Laterality Date  . APPENDECTOMY     1985  . SHOULDER ARTHROSCOPY WITH OPEN ROTATOR CUFF REPAIR AND DISTAL CLAVICLE ACROMINECTOMY Right 11/02/2015   Procedure: SHOULDER DIAGNOSTIC OPERATIVE ARTHROSCOPY WITH MINI-OPEN ROTATOR CUFF REPAIR, DISTAL CLAVICLE EXCISION,  BICEPS TENODESIS.  ;  Surgeon: Cammy Copa, MD;  Location: [facility];  Service:  Orthopedics;  Laterality: Right;  . TONSILLECTOMY     T+A 1980  . TOTAL HIP ARTHROPLASTY Left 01/30/2016   Procedure: LEFT TOTAL HIP ARTHROPLASTY ANTERIOR APPROACH;  Surgeon: Cammy Copa, MD;  Location: [facility];  Service: Orthopedics;  Laterality: Left;  . TOTAL HIP ARTHROPLASTY Right 12/09/2017   Procedure: RIGHT TOTAL HIP ARTHROPLASTY ANTERIOR APPROACH;  Surgeon: Cammy Copa, MD;  Location: [facility] OR;  Service: Orthopedics;  Laterality: Right;  Marland Kitchen VARICOSE VEIN SURGERY     1993  . VASECTOMY     2010   Social History   Occupational History  . Not on file  Tobacco Use  . 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 Smokeless tobacco: Never Used  . Tobacco comment: quit over 20+ years ago and only occasionally smoked then  Substance and Sexual Activity  . Alcohol use: Yes    Alcohol/week: 0.0 oz    Types: 4 - 5 Glasses of wine per week  . Drug use: No  . Sexual activity: Not on file

## 2018-02-20 ENCOUNTER — Telehealth (INDEPENDENT_AMBULATORY_CARE_PROVIDER_SITE_OTHER): Payer: Self-pay | Admitting: Orthopedic Surgery

## 2018-02-20 NOTE — Telephone Encounter (Signed)
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 He had his total hip replacement surgery over 3 months ago. CB # [phone]

## 2018-02-20 NOTE — Telephone Encounter (Signed)
No ask dean next week.

## 2018-02-20 NOTE — Telephone Encounter (Signed)
Please advise on swimming?

## 2018-02-23 NOTE — Telephone Encounter (Signed)
Okay to swim not before 6 weeks after surgery

## 2018-02-23 NOTE — Telephone Encounter (Signed)
IC patient and advised ok to swim now, he is > 6 weeks postop.  LMVM

## 2018-04-08 ENCOUNTER — Telehealth (INDEPENDENT_AMBULATORY_CARE_PROVIDER_SITE_OTHER): Payer: Self-pay | Admitting: Orthopedic Surgery

## 2018-04-08 NOTE — Telephone Encounter (Signed)
Letter faxed.

## 2018-04-08 NOTE — Telephone Encounter (Signed)
[facility] needs a medical clearance/confirmation of predental antibiotic faxed over to them for a procedure patient is having on Monday at 11 a.m. Their fax # [phone]

## 2018-04-29 ENCOUNTER — Encounter (INDEPENDENT_AMBULATORY_CARE_PROVIDER_SITE_OTHER): Payer: Self-pay | Admitting: Orthopedic Surgery

## 2018-04-29 ENCOUNTER — Ambulatory Visit (INDEPENDENT_AMBULATORY_CARE_PROVIDER_SITE_OTHER): Payer: PRIVATE HEALTH INSURANCE | Admitting: Orthopedic Surgery

## 2018-04-29 DIAGNOSIS — Z96641 Presence of right artificial hip joint: Secondary | ICD-10-CM

## 2018-05-01 ENCOUNTER — Encounter (INDEPENDENT_AMBULATORY_CARE_PROVIDER_SITE_OTHER): Payer: Self-pay | Admitting: Orthopedic Surgery

## 2018-05-01 NOTE — Progress Notes (Signed)
   Post-Op Visit Note   Patient: Chad LevineMichael I Snooks           Date of Birth: 09/24/1966           MRN: [id number] Visit Date: 04/29/2018 PCP: Floyde Parkinsyer, Christopher, MD   Assessment & Plan:  Chief Complaint:  Chief Complaint  Patient presents with  . Right Hip - Follow-up   Visit Diagnoses:  1. Status post total replacement of right hip     Plan: Casimiro NeedleMichael is a patient who is now about 4 months out right total hip replacement.  He is doing well.  He is in therapy 1-2 times a week working on flexibility.  He is transitioning from one type of job to another.  On examination he has normal gait and equal leg lengths.  Leg strength is excellent.  No groin pain with internal/external rotation of the leg.  Plan at this time is for him to continue to work on strengthening.  Continue to work on flexibility.  I think off is okay for him at this point.  I will see him back as needed.  Follow-Up Instructions: Return if symptoms worsen or fail to improve.   Orders:  No orders of the defined types were placed in this encounter.  No orders of the defined types were placed in this encounter.   Imaging: No results found.  PMFS History: Patient Active Problem List   Diagnosis Date Noted  . Hip arthritis 01/30/2016   Past Medical History:  Diagnosis Date  . Anger   . Anxiety   . Arthritis   . Asthma    MILD  DUE TO ALLERGIES   (RESOLVED)  . Bipolar disorder (HCC)   . Depression   . Varicose vein of leg     History reviewed. No pertinent family history.  Past Surgical History:  Procedure Laterality Date  . APPENDECTOMY     1985  . SHOULDER ARTHROSCOPY WITH OPEN ROTATOR CUFF REPAIR AND DISTAL CLAVICLE ACROMINECTOMY Right 11/02/2015   Procedure: SHOULDER DIAGNOSTIC OPERATIVE ARTHROSCOPY WITH MINI-OPEN ROTATOR CUFF REPAIR, DISTAL CLAVICLE EXCISION,  BICEPS TENODESIS.  ;  Surgeon: Cammy CopaScott Ada Woodbury, MD;  Location: [facility] OR;  Service: Orthopedics;  Laterality: Right;  . TONSILLECTOMY     T+A 1980    . TOTAL HIP ARTHROPLASTY Left 01/30/2016   Procedure: LEFT TOTAL HIP ARTHROPLASTY ANTERIOR APPROACH;  Surgeon: Cammy CopaScott Sweden Lesure, MD;  Location: [facility] OR;  Service: Orthopedics;  Laterality: Left;  . TOTAL HIP ARTHROPLASTY Right 12/09/2017   Procedure: RIGHT TOTAL HIP ARTHROPLASTY ANTERIOR APPROACH;  Surgeon: Cammy Copaean, Chevette Fee Scott, MD;  [facility];  Service: Orthopedics;  Laterality: Right;  Marland Kitchen. VARICOSE VEIN SURGERY     1993  . VASECTOMY     2010   Social History   Occupational History  . Not on file  Tobacco Use  . Smoking status: Former Games developermoker  . Smokeless tobacco: Never Used  . Tobacco comment: quit over 20+ years ago and only occasionally smoked then  Substance and Sexual Activity  . Alcohol use: Yes    Alcohol/week: 2.4 - 3.0 oz    Types: 4 - 5 Glasses of wine per week  . Drug use: No  . Sexual activity: Not on file

## 2018-05-04 ENCOUNTER — Ambulatory Visit (INDEPENDENT_AMBULATORY_CARE_PROVIDER_SITE_OTHER): Payer: Non-veteran care | Admitting: Orthopedic Surgery
